# Patient Record
Sex: Female | Born: 1980 | Race: White | Hispanic: No | Marital: Single | Smoking: Never smoker
Health system: Southern US, Community
[De-identification: ages and names within clinical notes are randomized; demographics above are authoritative.]

## PROBLEM LIST (undated history)

## (undated) ENCOUNTER — Inpatient Hospital Stay (HOSPITAL_COMMUNITY): Payer: Self-pay

## (undated) DIAGNOSIS — N390 Urinary tract infection, site not specified: Secondary | ICD-10-CM

## (undated) DIAGNOSIS — Z789 Other specified health status: Secondary | ICD-10-CM

## (undated) DIAGNOSIS — O139 Gestational [pregnancy-induced] hypertension without significant proteinuria, unspecified trimester: Secondary | ICD-10-CM

## (undated) HISTORY — DX: Other specified health status: Z78.9

## (undated) HISTORY — PX: TONSILLECTOMY: SUR1361

---

## 2012-11-19 ENCOUNTER — Ambulatory Visit (INDEPENDENT_AMBULATORY_CARE_PROVIDER_SITE_OTHER): Payer: Self-pay | Admitting: Family Medicine

## 2012-11-19 ENCOUNTER — Encounter: Payer: Self-pay | Admitting: Family Medicine

## 2012-11-19 VITALS — BP 108/71 | Temp 98.5°F | Ht 59.0 in | Wt 149.0 lb

## 2012-11-19 DIAGNOSIS — O0930 Supervision of pregnancy with insufficient antenatal care, unspecified trimester: Secondary | ICD-10-CM | POA: Insufficient documentation

## 2012-11-19 DIAGNOSIS — O34219 Maternal care for unspecified type scar from previous cesarean delivery: Secondary | ICD-10-CM | POA: Insufficient documentation

## 2012-11-19 LAB — POCT URINALYSIS DIP (DEVICE)
Ketones, ur: NEGATIVE mg/dL
Protein, ur: NEGATIVE mg/dL
Urobilinogen, UA: 0.2 mg/dL (ref 0.0–1.0)
pH: 7.5 (ref 5.0–8.0)

## 2012-11-19 NOTE — Progress Notes (Signed)
Subjective:    Holly Terrell is being seen today for her first obstetrical visit.  This is not a planned pregnancy. She is at [redacted]w[redacted]d gestation by LMP. Her obstetrical history is significant for late prenatal care and previous cesarean section for breech presentation. Relationship with FOB: not with this FOB. Patient does intend to breast feed. Her first child had a partial cleft palate and was unable to latch on. Pregnancy history fully reviewed. Denies bleeding, cramping, loss of fluid today. Is feeling fetal movement.  Last pap May 2013, normal. No abnormal paps or colpos. No history of STDs.  Menstrual History: OB History    Grav Para Term Preterm Abortions TAB SAB Ect Mult Living   2 1 1  0 0 0 0 0 0 1       Patient's last menstrual period was 06/09/2012.    The following portions of the patient's history were reviewed and updated as appropriate: allergies, current medications, past family history, past medical history, past social history, past surgical history and problem list.  Review of Systems CONST:  No fever, chills, sweats HEENT: No vision changes CV: no chest pain, palpitations RESP: no cough, asthma, dyspnea GI:  Mild occasional heartburn, nausea. No diarrhea or constipation GU:  No dysuria or vaginal discharge MUSC-SKEL: Negative SKIN:  negative NEURO: Daily headaches, history of migraines. Takes tylenol or nothing and HA usually resolves   Objective:   Filed Vitals:   11/19/12 1048  BP: 108/71  Temp: 98.5 F (36.9 C)   GEN:  WNWD, no distress HEENT:  NCAT, EOMI, normal conjunctiva NECK: trachea midline, supple, no adenopathy, masses or thyromegaly CV:  RRR, no murmurs LUNGS: CTAB ABD:  Soft, gravid, fundal height 25 cm, normal bowel sounds, no tenderness, guarding or rebound PELV: normal external genitalia, normal vagina, moderate white vaginal discharge, normal cervix, no CMT EXTREM: warm, well perfused, no edema or tenderness NEURO: alert, oriented, no focal  deficits   Assessment:    Pregnancy at 23w 3d by LMP Reflux History of prior c-section for breech Headaches   Plan:    Initial labs drawn. Prenatal vitamins. Problem list reviewed and updated. AFP3 discussed: too late Role of ultrasound in pregnancy discussed; fetal survey: ordered Amniocentesis discussed: not indicated Pt desires TOLAC, discussed risks/benefits. Needs consent signed. Will get records from [place] of prior pregnancy and delivery, op note. Tylenol for headaches. Follow up in 4 weeks. 50% of 45 min visit spent on counseling and coordination of care.

## 2012-11-19 NOTE — Patient Instructions (Signed)
Pregnancy - Second Trimester The second trimester of pregnancy (3 to 6 months) is a period of rapid growth for you and your baby. At the end of the sixth month, your baby is about 9 inches long and weighs 1 1/2 pounds. You will begin to feel the baby move between 18 and 20 weeks of the pregnancy. This is called quickening. Weight gain is faster. A clear fluid (colostrum) may leak out of your breasts. You may feel small contractions of the womb (uterus). This is known as false labor or Braxton-Hicks contractions. This is like a practice for labor when the baby is ready to be born. Usually, the problems with morning sickness have usually passed by the end of your first trimester. Some women develop small dark blotches (called cholasma, mask of pregnancy) on their face that usually goes away after the baby is born. Exposure to the sun makes the blotches worse. Acne may also develop in some pregnant women and pregnant women who have acne, may find that it goes away. PRENATAL EXAMS  Blood work may continue to be done during prenatal exams. These tests are done to check on your health and the probable health of your baby. Blood work is used to follow your blood levels (hemoglobin). Anemia (low hemoglobin) is common during pregnancy. Iron and vitamins are given to help prevent this. You will also be checked for diabetes between 24 and 28 weeks of the pregnancy. Some of the previous blood tests may be repeated.  The size of the uterus is measured during each visit. This is to make sure that the baby is continuing to grow properly according to the dates of the pregnancy.  Your blood pressure is checked every prenatal visit. This is to make sure you are not getting toxemia.  Your urine is checked to make sure you do not have an infection, diabetes or protein in the urine.  Your weight is checked often to make sure gains are happening at the suggested rate. This is to ensure that both you and your baby are growing  normally.  Sometimes, an ultrasound is performed to confirm the proper growth and development of the baby. This is a test which bounces harmless sound waves off the baby so your caregiver can more accurately determine due dates. Sometimes, a specialized test is done on the amniotic fluid surrounding the baby. This test is called an amniocentesis. The amniotic fluid is obtained by sticking a needle into the belly (abdomen). This is done to check the chromosomes in instances where there is a concern about possible genetic problems with the baby. It is also sometimes done near the end of pregnancy if an early delivery is required. In this case, it is done to help make sure the baby's lungs are mature enough for the baby to live outside of the womb. CHANGES OCCURING IN THE SECOND TRIMESTER OF PREGNANCY Your body goes through many changes during pregnancy. They vary from person to person. Talk to your caregiver about changes you notice that you are concerned about.  During the second trimester, you will likely have an increase in your appetite. It is normal to have cravings for certain foods. This varies from person to person and pregnancy to pregnancy.  Your lower abdomen will begin to bulge.  You may have to urinate more often because the uterus and baby are pressing on your bladder. It is also common to get more bladder infections during pregnancy (pain with urination). You can help this by   drinking lots of fluids and emptying your bladder before and after intercourse.  You may begin to get stretch marks on your hips, abdomen, and breasts. These are normal changes in the body during pregnancy. There are no exercises or medications to take that prevent this change.  You may begin to develop swollen and bulging veins (varicose veins) in your legs. Wearing support hose, elevating your feet for 15 minutes, 3 to 4 times a day and limiting salt in your diet helps lessen the problem.  Heartburn may develop  as the uterus grows and pushes up against the stomach. Antacids recommended by your caregiver helps with this problem. Also, eating smaller meals 4 to 5 times a day helps.  Constipation can be treated with a stool softener or adding bulk to your diet. Drinking lots of fluids, vegetables, fruits, and whole grains are helpful.  Exercising is also helpful. If you have been very active up until your pregnancy, most of these activities can be continued during your pregnancy. If you have been less active, it is helpful to start an exercise program such as walking.  Hemorrhoids (varicose veins in the rectum) may develop at the end of the second trimester. Warm sitz baths and hemorrhoid cream recommended by your caregiver helps hemorrhoid problems.  Backaches may develop during this time of your pregnancy. Avoid heavy lifting, wear low heal shoes and practice good posture to help with backache problems.  Some pregnant women develop tingling and numbness of their hand and fingers because of swelling and tightening of ligaments in the wrist (carpel tunnel syndrome). This goes away after the baby is born.  As your breasts enlarge, you may have to get a bigger bra. Get a comfortable, cotton, support bra. Do not get a nursing bra until the last month of the pregnancy if you will be nursing the baby.  You may get a dark line from your belly button to the pubic area called the linea nigra.  You may develop rosy cheeks because of increase blood flow to the face.  You may develop spider looking lines of the face, neck, arms and chest. These go away after the baby is born. HOME CARE INSTRUCTIONS   It is extremely important to avoid all smoking, herbs, alcohol, and unprescribed drugs during your pregnancy. These chemicals affect the formation and growth of the baby. Avoid these chemicals throughout the pregnancy to ensure the delivery of a healthy infant.  Most of your home care instructions are the same as  suggested for the first trimester of your pregnancy. Keep your caregiver's appointments. Follow your caregiver's instructions regarding medication use, exercise and diet.  During pregnancy, you are providing food for you and your baby. Continue to eat regular, well-balanced meals. Choose foods such as meat, fish, milk and other low fat dairy products, vegetables, fruits, and whole-grain breads and cereals. Your caregiver will tell you of the ideal weight gain.  A physical sexual relationship may be continued up until near the end of pregnancy if there are no other problems. Problems could include early (premature) leaking of amniotic fluid from the membranes, vaginal bleeding, abdominal pain, or other medical or pregnancy problems.  Exercise regularly if there are no restrictions. Check with your caregiver if you are unsure of the safety of some of your exercises. The greatest weight gain will occur in the last 2 trimesters of pregnancy. Exercise will help you:  Control your weight.  Get you in shape for labor and delivery.  Lose weight   after you have the baby.  Wear a good support or jogging bra for breast tenderness during pregnancy. This may help if worn during sleep. Pads or tissues may be used in the bra if you are leaking colostrum.  Do not use hot tubs, steam rooms or saunas throughout the pregnancy.  Wear your seat belt at all times when driving. This protects you and your baby if you are in an accident.  Avoid raw meat, uncooked cheese, cat litter boxes and soil used by cats. These carry germs that can cause birth defects in the baby.  The second trimester is also a good time to visit your dentist for your dental health if this has not been done yet. Getting your teeth cleaned is OK. Use a soft toothbrush. Brush gently during pregnancy.  It is easier to loose urine during pregnancy. Tightening up and strengthening the pelvic muscles will help with this problem. Practice stopping your  urination while you are going to the bathroom. These are the same muscles you need to strengthen. It is also the muscles you would use as if you were trying to stop from passing gas. You can practice tightening these muscles up 10 times a set and repeating this about 3 times per day. Once you know what muscles to tighten up, do not perform these exercises during urination. It is more likely to contribute to an infection by backing up the urine.  Ask for help if you have financial, counseling or nutritional needs during pregnancy. Your caregiver will be able to offer counseling for these needs as well as refer you for other special needs.  Your skin may become oily. If so, wash your face with mild soap, use non-greasy moisturizer and oil or cream based makeup. MEDICATIONS AND DRUG USE IN PREGNANCY  Take prenatal vitamins as directed. The vitamin should contain 1 milligram of folic acid. Keep all vitamins out of reach of children. Only a couple vitamins or tablets containing iron may be fatal to a baby or young child when ingested.  Avoid use of all medications, including herbs, over-the-counter medications, not prescribed or suggested by your caregiver. Only take over-the-counter or prescription medicines for pain, discomfort, or fever as directed by your caregiver. Do not use aspirin.  Let your caregiver also know about herbs you may be using.  Alcohol is related to a number of birth defects. This includes fetal alcohol syndrome. All alcohol, in any form, should be avoided completely. Smoking will cause low birth rate and premature babies.  Street or illegal drugs are very harmful to the baby. They are absolutely forbidden. A baby born to an addicted mother will be addicted at birth. The baby will go through the same withdrawal an adult does. SEEK MEDICAL CARE IF:  You have any concerns or worries during your pregnancy. It is better to call with your questions if you feel they cannot wait, rather  than worry about them. SEEK IMMEDIATE MEDICAL CARE IF:   An unexplained oral temperature above 102 F (38.9 C) develops, or as your caregiver suggests.  You have leaking of fluid from the vagina (birth canal). If leaking membranes are suspected, take your temperature and tell your caregiver of this when you call.  There is vaginal spotting, bleeding, or passing clots. Tell your caregiver of the amount and how many pads are used. Light spotting in pregnancy is common, especially following intercourse.  You develop a bad smelling vaginal discharge with a change in the color from clear   to white.  You continue to feel sick to your stomach (nauseated) and have no relief from remedies suggested. You vomit blood or coffee ground-like materials.  You lose more than 2 pounds of weight or gain more than 2 pounds of weight over 1 week, or as suggested by your caregiver.  You notice swelling of your face, hands, feet, or legs.  You get exposed to German measles and have never had them.  You are exposed to fifth disease or chickenpox.  You develop belly (abdominal) pain. Round ligament discomfort is a common non-cancerous (benign) cause of abdominal pain in pregnancy. Your caregiver still must evaluate you.  You develop a bad headache that does not go away.  You develop fever, diarrhea, pain with urination, or shortness of breath.  You develop visual problems, blurry, or double vision.  You fall or are in a car accident or any kind of trauma.  There is mental or physical violence at home. Document Released: 09/25/2001 Document Revised: 12/24/2011 Document Reviewed: 03/30/2009 [employer] Patient Information 2013 [employer].  

## 2012-11-19 NOTE — Progress Notes (Signed)
Pulse: 85

## 2012-11-20 ENCOUNTER — Ambulatory Visit (HOSPITAL_COMMUNITY)
Admission: RE | Admit: 2012-11-20 | Discharge: 2012-11-20 | Disposition: A | Discharge status: Home / Self Care | Payer: Medicaid Other | Source: Ambulatory Visit | Attending: Family Medicine | Admitting: Family Medicine

## 2012-11-20 ENCOUNTER — Encounter: Payer: Self-pay | Admitting: Family Medicine

## 2012-11-20 ENCOUNTER — Other Ambulatory Visit: Payer: Self-pay | Admitting: Family Medicine

## 2012-11-20 DIAGNOSIS — O283 Abnormal ultrasonic finding on antenatal screening of mother: Secondary | ICD-10-CM

## 2012-11-20 DIAGNOSIS — O0930 Supervision of pregnancy with insufficient antenatal care, unspecified trimester: Secondary | ICD-10-CM

## 2012-11-20 DIAGNOSIS — O34219 Maternal care for unspecified type scar from previous cesarean delivery: Secondary | ICD-10-CM

## 2012-11-20 LAB — OBSTETRIC PANEL
Antibody Screen: NEGATIVE
Basophils Absolute: 0 10*3/uL (ref 0.0–0.1)
Basophils Relative: 0 % (ref 0–1)
Eosinophils Relative: 1 % (ref 0–5)
HCT: 31.2 % — ABNORMAL LOW (ref 36.0–46.0)
MCHC: 34 g/dL (ref 30.0–36.0)
Monocytes Absolute: 0.5 10*3/uL (ref 0.1–1.0)
Neutro Abs: 5.8 10*3/uL (ref 1.7–7.7)
RDW: 14.7 % (ref 11.5–15.5)

## 2012-11-24 ENCOUNTER — Telehealth: Payer: Self-pay | Admitting: *Deleted

## 2012-11-24 NOTE — Telephone Encounter (Signed)
Called Holly Terrell and notified her that her provider had asked [place] to call and let her know the ultrasound results came back as abnormal needing further detailed ultrasound- and that we have made that appt in [facility] and after scan she will meet with the specialist to go over results.  Gave patient the appointment and answered questions. Patient voices understanding.

## 2012-11-24 NOTE — Telephone Encounter (Signed)
Message copied by Gerome Apley on Mon Nov 24, 2012  8:55 AM ------      Message from: FERRY, [place]      Created: Thu Nov 20, 2012  6:17 PM       Abnormal fetal anatomy scan. I put in referral to [facility]FM for genetic counseling/f/u scan of fetal lateral ventricles. I don't know what they told the patient when they were doing the ultrasound. How do we best handle this? Do you need me to call her?  Truitt Merle ------

## 2012-11-26 ENCOUNTER — Other Ambulatory Visit: Payer: Self-pay | Admitting: Family Medicine

## 2012-11-26 DIAGNOSIS — O359XX0 Maternal care for (suspected) fetal abnormality and damage, unspecified, not applicable or unspecified: Secondary | ICD-10-CM

## 2012-11-28 ENCOUNTER — Ambulatory Visit (HOSPITAL_COMMUNITY): Admission: RE | Admit: 2012-11-28 | Payer: Self-pay | Source: Ambulatory Visit

## 2012-11-28 ENCOUNTER — Ambulatory Visit (HOSPITAL_COMMUNITY): Payer: Self-pay

## 2012-12-02 ENCOUNTER — Encounter: Payer: Self-pay | Admitting: Family Medicine

## 2012-12-02 DIAGNOSIS — O283 Abnormal ultrasonic finding on antenatal screening of mother: Secondary | ICD-10-CM | POA: Insufficient documentation

## 2012-12-03 ENCOUNTER — Ambulatory Visit (HOSPITAL_COMMUNITY)
Admission: RE | Admit: 2012-12-03 | Discharge: 2012-12-03 | Disposition: A | Discharge status: Home / Self Care | Payer: Medicaid Other | Source: Ambulatory Visit | Attending: Family Medicine | Admitting: Family Medicine

## 2012-12-03 ENCOUNTER — Encounter (HOSPITAL_COMMUNITY): Payer: Self-pay

## 2012-12-03 ENCOUNTER — Other Ambulatory Visit: Payer: Self-pay

## 2012-12-03 ENCOUNTER — Other Ambulatory Visit: Payer: Self-pay | Admitting: Family Medicine

## 2012-12-03 VITALS — BP 109/63 | HR 100 | Wt 154.2 lb

## 2012-12-03 DIAGNOSIS — Z3689 Encounter for other specified antenatal screening: Secondary | ICD-10-CM | POA: Insufficient documentation

## 2012-12-03 DIAGNOSIS — O359XX0 Maternal care for (suspected) fetal abnormality and damage, unspecified, not applicable or unspecified: Secondary | ICD-10-CM

## 2012-12-03 DIAGNOSIS — O283 Abnormal ultrasonic finding on antenatal screening of mother: Secondary | ICD-10-CM

## 2012-12-03 DIAGNOSIS — O358XX0 Maternal care for other (suspected) fetal abnormality and damage, not applicable or unspecified: Secondary | ICD-10-CM | POA: Insufficient documentation

## 2012-12-03 NOTE — Progress Notes (Signed)
Genetic Counseling  High-Risk Gestation Note  Appointment Date:  12/03/2012 Referred By: Napoleon Form, MD Date of Birth:  10-Sep-1981  Pregnancy History: G2P1001 Estimated Date of Delivery: 03/16/13 Estimated Gestational Age: [redacted]w[redacted]d Attending: Alpha Gula, MD  I met with Ms. Holly Terrell for genetic counseling because of an abnormal ultrasound finding.  Ms. Shone friend attended the appointment today.  We began by reviewing the ultrasound in detail. Ultrasound today revealed ventriculomegaly, with the left lateral ventricle measuring 14 mm and the right measuring 10 mm.  We discussed that ventriculomegaly refers to an abnormal accumulation of cerebrospinal fluid, resulting in enlargement of the cerebral ventricles.  Ventriculomegaly can occur as an isolated finding, but it can be associated with other intracranial and/or extracranial abnormalities, some of which may not be identified until after birth.  Some of the more commonly associated extracranial anomalies include spina bifida and renal, cardiac, and gastrointestinal anomalies.  The remainder of the fetal anatomy appeared normal today.  The ultrasound report will be documented separately.  We discussed that the finding of fetal ventriculomegaly is associated with an approximate 9 fold increase in risk for aneuploidy, specifically Down syndrome.  We reviewed chromosomes, nondisjunction, and the common features and prognoses of Down syndrome, trisomy 46, and trisomy 71.  In addition, we discussed other etiologies for fetal ventriculomegaly including other chromosome aberrations (deletions, duplications, inversions, and translocations), single gene conditions, multifactorial conditions, sporadic causes (intracranial hemorrhage), and environmental causes (infections).  [name] was counseled regarding available screening and diagnostic options for detection of fetal aneuploidy.  Specifically, we discussed the option of noninvasive prenatal testing  (NIPT) and amniocentesis.  NIPT analyzes cell free fetal DNA found in the maternal circulation. This test is not diagnostic for chromosome conditions, but can provide information regarding the presence or absence of extra fetal DNA for chromosomes 13, 18, 21, X, and Y, and missing fetal DNA for chromosome X (Turner syndrome) and Y. Thus, it would not identify or rule out all genetic conditions. The reported detection rate is greater than 99% for Trisomy 21, greater than 98% for Trisomy 18, and is approximately 80% (8 out of 10) for Trisomy 13. The false positive rate is reported to be less than 0.1% for any of these conditions.   In addition, we discussed the risks, benefits, and limitations of amniocentesis, including the approximate 1 in 300-500 risk for complications, including spontaneous pregnancy loss. We discussed that cells derived from amniocentesis can be used for standard karyotype analysis and/or microarray analysis.  She understands that microarray analysis may identify genomic copy number variants that may be missed with standard karyotyping. In addition, she was informed that amniocentesis can identify some infections and neural tube defects, but typically does not provide information regarding the possibility of single-gene conditions since such syndromes are not detected by routine karyotyping.  Single-gene conditions are typically diagnosed through postnatal genetic testing following evaluation by a medical geneticist, unless the ultrasound findings are strongly suggestive of a specific single-gene condition.  After consideration of all the options, she elected to proceed with NIPT.  The results will be available in ~8-10 business days.   She was counseled that the prognosis for ventriculomegaly detected prenatally is variable, being highly dependent upon the presence of associated intracranial and extracranial anomalies and the underlying cause of the ventriculomegaly.  The degree of  ventriculomegaly does not always correlate with the prognosis, as an occasional infant with severe ventriculomegaly (>15 mm) has normal intellectual development.  However, most infants with severe  ventriculomegaly have some degree of intellectual impairment and are at risk for seizures and other health problems.  It was noted that ventriculomegaly (postnatally referred to as hydrocephalus) is commonly treated with ventriculoperitoneal shunting.  While this treatment does not correct any damage to the brain that may have occurred in utero, it generally improves survival and decreases morbidity.    Because ventriculomegaly can regress or worsen during pregnancy, we discussed the option of serial sonography.  Should this finding persist/worsen, we discussed the availability of consultation with a pediatric neurosurgeon.    Both family histories were reviewed and found to be noncontributory for birth defects, mental retardation, and known genetic conditions.  A note in [name] chart states that her son was born with mild cleft lip.  [name] stated today that this was not the case.  She said that her son had difficulty with breast feeding/latching on because of a high arched palate; however, he did not have any orofacial clefting at birth.  Without further information regarding the provided family history, an accurate genetic risk cannot be calculated. Further genetic counseling is warranted if more information is obtained.  [name] was provided with written information regarding cystic fibrosis (CF) including the carrier frequency and incidence in the Caucasian population, the availability of carrier testing and prenatal diagnosis if indicated.  In addition, we discussed that CF is routinely screened for as part of the [place] newborn screening panel.  She declined testing today.   [name] denied exposure to environmental toxins or chemical agents. She denied the use of alcohol, tobacco or street drugs. She  denied significant viral illnesses during the course of her pregnancy.   I counseled [name] regarding the above risks and available options.  The approximate face-to-face time with the genetic counselor was 38 minutes.  Despina Arias, MS Certified Genetic Counselor

## 2012-12-03 NOTE — Progress Notes (Signed)
Markala Sitts  was seen today for an ultrasound appointment.  See full report in [facility].  Comments: Ms. Tucciarone was seen today due to suspected ventriculomegaly on recent ultrasound.  On exam today, the right lateral ventricle measures approximately 10 mm; the left 14 mm.  The remainder of the cranial anatomy and spine appear normal.  No cranial calficiations were noted that would be suspicious of  a viral infection. No markers associated with aneuploidy were noted.  The findings and limitations of the study were discussed with the patient.  We brieflly reviewed the differential diagnosis.  Mild ventriculomegaly may be associated with aneuploidy (LR 9) or fetal viral infections (TORCH infections).  The patient presented late to care and maternal serum screening was not performed.  See separate Genetics Counseling note.  The patient elected to undergo NIPT.  In the absence of other ultrasound findings, will defer viral serologies- but would entertain a work up if this finding is persistent at follow up.  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  Alpha Gula, MD

## 2012-12-04 ENCOUNTER — Encounter: Payer: Self-pay | Admitting: Family Medicine

## 2012-12-12 ENCOUNTER — Telehealth (HOSPITAL_COMMUNITY): Payer: Self-pay

## 2012-12-12 NOTE — Telephone Encounter (Signed)
Called Holly Terrell to discuss her [facility], cell free fetal DNA testing. Patient was identified by name and DOB. Testing was offered because of the ultrasound finding of ventriculomegaly. We reviewed that these are within normal limits, showing a less than 1 in 10,000 risk for trisomies 21, 18 and 13. We reviewed that this testing identifies > 99% of pregnancies with trisomy 21, >98% of pregnancies with trisomy 42, and >80% with trisomy 66; the false positive rate is <0.1% for all conditions. Testing for X and Y chromosome analysis was not performed. She understands that this testing does not identify all genetic conditions. All questions were answered to her satisfaction, she was encouraged to call with additional questions or concerns.  Despina Arias, MS Certified Genetic Counselor

## 2012-12-17 ENCOUNTER — Ambulatory Visit (INDEPENDENT_AMBULATORY_CARE_PROVIDER_SITE_OTHER): Payer: Medicaid Other | Admitting: Advanced Practice Midwife

## 2012-12-17 VITALS — BP 116/64 | Temp 97.1°F | Wt 157.0 lb

## 2012-12-17 DIAGNOSIS — O0930 Supervision of pregnancy with insufficient antenatal care, unspecified trimester: Secondary | ICD-10-CM

## 2012-12-17 DIAGNOSIS — O99810 Abnormal glucose complicating pregnancy: Secondary | ICD-10-CM

## 2012-12-17 DIAGNOSIS — K219 Gastro-esophageal reflux disease without esophagitis: Secondary | ICD-10-CM

## 2012-12-17 DIAGNOSIS — O0933 Supervision of pregnancy with insufficient antenatal care, third trimester: Secondary | ICD-10-CM

## 2012-12-17 LAB — POCT URINALYSIS DIP (DEVICE)
Bilirubin Urine: NEGATIVE
Glucose, UA: NEGATIVE mg/dL
Nitrite: NEGATIVE
Urobilinogen, UA: 0.2 mg/dL (ref 0.0–1.0)

## 2012-12-17 NOTE — Addendum Note (Signed)
Addended by: Franchot Mimes on: 12/17/2012 11:49 AM   Modules accepted: Orders

## 2012-12-17 NOTE — Progress Notes (Signed)
Pulse 99 Vaginal d/c stated as thin clear.

## 2012-12-17 NOTE — Addendum Note (Signed)
Addended by: Franchot Mimes on: 12/17/2012 11:01 AM   Modules accepted: Orders

## 2012-12-17 NOTE — Progress Notes (Signed)
Doing well. One episode of acid reflux. Does not want med Rx now. Probably moving to [place] in April. Discussed we will need to copy her records for her trip.

## 2012-12-17 NOTE — Patient Instructions (Signed)
Heartburn During Pregnancy  Heartburn is a burning sensation in the chest caused by stomach acid backing up into the esophagus. Heartburn (also known as "reflux") is common in pregnancy because a certain hormone (progesterone) changes. The progesterone hormone may relax the valve that separates the esophagus from the stomach. This allows acid to go up into the esophagus, causing heartburn. Heartburn may also happen in pregnancy because the enlarging uterus pushes up on the stomach, which pushes more acid into the esophagus. This is especially true in the later stages of pregnancy. Heartburn problems usually go away after giving birth. CAUSES   The progesterone hormone.  Changing hormone levels.  The growing uterus that pushes stomach acid upward.  Large meals.  Certain foods and drinks.  Exercise.  Increased acid production. SYMPTOMS   Burning pain in the chest or lower throat.  Bitter taste in the mouth.  Coughing. DIAGNOSIS  Heartburn is typically diagnosed by your caregiver when taking a careful history of your concern. Your caregiver may order a blood test to check for a certain type of bacteria that is associated with heartburn. Sometimes, heartburn is diagnosed by prescribing a heartburn medicine to see if the symptoms improve. It is rare in pregnancy to have a procedure called an endoscopy. This is when a tube with a light and a camera on the end is used to examine the esophagus and the stomach. TREATMENT   Your caregiver may tell you to use certain over-the-counter medicines (antacids, acid reducers) for mild heartburn.  Your caregiver may prescribe medicines to decrease stomach acid or to protect your stomach lining.  Your caregiver may recommend certain diet changes.  For severe cases, your caregiver may recommend that the head of the bed be elevated on blocks. (Sleeping with more pillows is not an effective treatment as it only changes the position of your head and does  not improve the main problem of stomach acid refluxing into the esophagus.) HOME CARE INSTRUCTIONS   Take all medicines as directed by your caregiver.  Raise the head of your bed by putting blocks under the legs if instructed to by your caregiver.  Do not exercise right after eating.  Avoid eating 2 or 3 hours before bed. Do not lie down right after eating.  Eat small meals throughout the day instead of 3 large meals.  Identify foods and beverages that make your symptoms worse and avoid them. Foods you may want to avoid include:  Peppers.  Chocolate.  High-fat foods, including fried foods.  Spicy foods.  Garlic and onions.  Citrus fruits, including oranges, grapefruit, lemons, and limes.  Food containing tomatoes or tomato products.  Mint.  Carbonated and caffeinated drinks.  Vinegar. SEEK IMMEDIATE MEDICAL CARE IF:   You have severe chest pain that goes down your arm or into your jaw or neck.  You feel sweaty, dizzy, or lightheaded.  You become short of breath.  You vomit blood.  You have difficulty or pain with swallowing.  You have bloody or black, tarry stools.  You have episodes of heartburn more than 3 times a week, for more than 2 weeks. MAKE SURE YOU:  Understand these instructions.  Will watch your condition.  Will get help right away if you are not doing well or get worse. Document Released: 09/28/2000 Document Revised: 12/24/2011 Document Reviewed: 03/22/2011 [employer] Patient Information 2013 [employer].  

## 2012-12-18 DIAGNOSIS — O99810 Abnormal glucose complicating pregnancy: Secondary | ICD-10-CM | POA: Insufficient documentation

## 2012-12-19 ENCOUNTER — Telehealth: Payer: Self-pay | Admitting: Obstetrics and Gynecology

## 2012-12-19 NOTE — Telephone Encounter (Addendum)
Message copied by Toula Moos on Fri Dec 19, 2012 10:02 AM ------      Message from: [facility] Signs      Created: Thu Dec 18, 2012 10:01 AM      Regarding: need 3 hr GTT       Glucola 136            Needs 3 hr GTT            Thanks ------ CALLED PATIENT and notified of abnormal 1 hr GCT and appt made for 3 hr GTT. PATIENT AGREES and satisfied.

## 2012-12-24 ENCOUNTER — Other Ambulatory Visit: Payer: Medicaid Other

## 2012-12-24 DIAGNOSIS — R7309 Other abnormal glucose: Secondary | ICD-10-CM

## 2012-12-25 ENCOUNTER — Encounter (HOSPITAL_COMMUNITY): Payer: Self-pay

## 2012-12-25 ENCOUNTER — Inpatient Hospital Stay (HOSPITAL_COMMUNITY)
Admission: AD | Admit: 2012-12-25 | Discharge: 2012-12-25 | Disposition: A | Discharge status: Home / Self Care | Payer: Medicaid Other | Source: Ambulatory Visit | Attending: Obstetrics and Gynecology | Admitting: Obstetrics and Gynecology

## 2012-12-25 ENCOUNTER — Encounter: Payer: Self-pay | Admitting: Obstetrics and Gynecology

## 2012-12-25 DIAGNOSIS — D649 Anemia, unspecified: Secondary | ICD-10-CM | POA: Insufficient documentation

## 2012-12-25 DIAGNOSIS — O99019 Anemia complicating pregnancy, unspecified trimester: Secondary | ICD-10-CM | POA: Insufficient documentation

## 2012-12-25 DIAGNOSIS — O212 Late vomiting of pregnancy: Secondary | ICD-10-CM | POA: Insufficient documentation

## 2012-12-25 DIAGNOSIS — O99012 Anemia complicating pregnancy, second trimester: Secondary | ICD-10-CM

## 2012-12-25 DIAGNOSIS — O219 Vomiting of pregnancy, unspecified: Secondary | ICD-10-CM

## 2012-12-25 LAB — COMPREHENSIVE METABOLIC PANEL
AST: 11 U/L (ref 0–37)
Albumin: 2.4 g/dL — ABNORMAL LOW (ref 3.5–5.2)
Calcium: 8.8 mg/dL (ref 8.4–10.5)
Creatinine, Ser: 0.56 mg/dL (ref 0.50–1.10)

## 2012-12-25 LAB — URINALYSIS, ROUTINE W REFLEX MICROSCOPIC
Glucose, UA: NEGATIVE mg/dL
Nitrite: NEGATIVE
Protein, ur: NEGATIVE mg/dL
Urobilinogen, UA: 0.2 mg/dL (ref 0.0–1.0)

## 2012-12-25 LAB — CBC
MCH: 28.7 pg (ref 26.0–34.0)
MCHC: 33.5 g/dL (ref 30.0–36.0)
MCV: 85.7 fL (ref 78.0–100.0)
Platelets: 177 10*3/uL (ref 150–400)
RDW: 12.9 % (ref 11.5–15.5)
WBC: 10.1 10*3/uL (ref 4.0–10.5)

## 2012-12-25 LAB — GLUCOSE TOLERANCE, 3 HOURS
Glucose Tolerance, 1 hour: 115 mg/dL (ref 70–189)
Glucose Tolerance, Fasting: 76 mg/dL (ref 70–104)

## 2012-12-25 LAB — URINE MICROSCOPIC-ADD ON

## 2012-12-25 MED ORDER — ONDANSETRON HCL 4 MG PO TABS: 4.0000 mg | ORAL_TABLET | Freq: Every day | ORAL | Status: DC | PRN

## 2012-12-25 MED ORDER — ONDANSETRON 8 MG PO TBDP
8.0000 mg | ORAL_TABLET | ORAL | Status: AC
  Administered 2012-12-25: 8 mg via ORAL
  Filled 2012-12-25: qty 1

## 2012-12-25 MED ORDER — FERROUS SULFATE 325 (65 FE) MG PO TABS: 325.0000 mg | ORAL_TABLET | Freq: Two times a day (BID) | ORAL | Status: DC

## 2012-12-25 NOTE — MAU Note (Signed)
Name and DOB verified, pt confirmed spelling on armboard is correct.

## 2012-12-25 NOTE — MAU Note (Signed)
Patient is in with c/o new onset nausea and fatigue. She states that she feels dizzy when she gets up. She denies vaginal bleeding or lof. She reports good fetal movement and feels pelvic pressure with fetal movements.

## 2012-12-25 NOTE — MAU Provider Note (Signed)
Chief Complaint:  Nausea and Fatigue   First Provider Initiated Contact with Patient 12/25/12 1037      HPI: Holly Terrell is a 32 y.o. G2P1001 at [redacted]w[redacted]d who presents to maternity admissions reporting nausea and increased fatigue since waking up this morning.  She has kept down fluids today but reports she has not felt like eating anything. She denies feeling cramping/contractions but does feel pelvic pressure whenever the baby moves, a few times an hour. She reports good fetal movement, denies LOF, vaginal bleeding, vaginal itching/burning, urinary symptoms, h/a, dizziness, n/v, or fever/chills.     Past Medical History: Past Medical History  Diagnosis Date  . No pertinent past medical history     Past obstetric history: OB History   Grav Para Term Preterm Abortions TAB SAB Ect Mult Living   2 1 1  0 0 0 0 0 0 1     # Outc Date GA Lbr Len/2nd Wgt Sex Del Anes PTL Lv   1 TRM 5/08 [redacted]w[redacted]d   M LTCS   Yes   Comments: BReech   2 CUR               Past Surgical History: Past Surgical History  Procedure Laterality Date  . Cesarean section    . Tonsillectomy      Family History: Family History  Problem Relation Age of Onset  . Hypertension Mother   . Birth defects Son     Social History: History  Substance Use Topics  . Smoking status: Never Smoker   . Smokeless tobacco: Never Used  . Alcohol Use: No    Allergies: No Known Allergies  Meds:  Prescriptions prior to admission  Medication Sig Dispense Refill  . calcium carbonate (TUMS - DOSED IN MG ELEMENTAL CALCIUM) 500 MG chewable tablet Chew 2 tablets by mouth as needed for heartburn.      . Prenatal Vit-Fe Fumarate-FA (PRENATAL MULTIVITAMIN) TABS Take 1 tablet by mouth daily at 12 noon.        ROS: Pertinent findings in history of present illness.  Physical Exam  Blood pressure 115/57, pulse 100, temperature 98.2 F (36.8 C), resp. rate 16, weight 73.936 kg (163 lb), last menstrual period 06/09/2012, SpO2  99.00%. GENERAL: Well-developed, well-nourished female in no acute distress.  HEENT: normocephalic HEART: normal rate RESP: normal effort ABDOMEN: Soft, non-tender, gravid appropriate for gestational age EXTREMITIES: Nontender, no edema NEURO: alert and oriented  Dilation: Closed Effacement (%): Thick Cervical Position: Posterior Exam by:: lisa leftwich-kirby cnm  FHT:  Baseline 135 , moderate variability, accelerations present, no decelerations Contractions: some irritability noted, mild to palpation   Labs: Results for orders placed during the hospital encounter of 12/25/12 (from the past 24 hour(s))  URINALYSIS, ROUTINE W REFLEX MICROSCOPIC     Status: Abnormal   Collection Time    12/25/12 10:05 AM      Result Value Range   Color, Urine YELLOW  YELLOW   APPearance CLEAR  CLEAR   Specific Gravity, Urine <1.005 (*) 1.005 - 1.030   pH 6.5  5.0 - 8.0   Glucose, UA NEGATIVE  NEGATIVE mg/dL   Hgb urine dipstick NEGATIVE  NEGATIVE   Bilirubin Urine NEGATIVE  NEGATIVE   Ketones, ur NEGATIVE  NEGATIVE mg/dL   Protein, ur NEGATIVE  NEGATIVE mg/dL   Urobilinogen, UA 0.2  0.0 - 1.0 mg/dL   Nitrite NEGATIVE  NEGATIVE   Leukocytes, UA TRACE (*) NEGATIVE  URINE MICROSCOPIC-ADD ON     Status: None  Collection Time    12/25/12 10:05 AM      Result Value Range   Squamous Epithelial / LPF RARE  RARE   WBC, UA 0-2  <3 WBC/hpf   Bacteria, UA RARE  RARE  CBC     Status: Abnormal   Collection Time    12/25/12 10:57 AM      Result Value Range   WBC 10.1  4.0 - 10.5 K/uL   RBC 3.00 (*) 3.87 - 5.11 MIL/uL   Hemoglobin 8.6 (*) 12.0 - 15.0 g/dL   HCT 16.1 (*) 09.6 - 04.5 %   MCV 85.7  78.0 - 100.0 fL   MCH 28.7  26.0 - 34.0 pg   MCHC 33.5  30.0 - 36.0 g/dL   RDW 40.9  81.1 - 91.4 %   Platelets 177  150 - 400 K/uL  COMPREHENSIVE METABOLIC PANEL     Status: Abnormal   Collection Time    12/25/12 10:57 AM      Result Value Range   Sodium 136  135 - 145 mEq/L   Potassium 4.0  3.5 -  5.1 mEq/L   Chloride 104  96 - 112 mEq/L   CO2 25  19 - 32 mEq/L   Glucose, Bld 86  70 - 99 mg/dL   BUN 3 (*) 6 - 23 mg/dL   Creatinine, Ser 7.82  0.50 - 1.10 mg/dL   Calcium 8.8  8.4 - 95.6 mg/dL   Total Protein 5.9 (*) 6.0 - 8.3 g/dL   Albumin 2.4 (*) 3.5 - 5.2 g/dL   AST 11  0 - 37 U/L   ALT 8  0 - 35 U/L   Alkaline Phosphatase 75  39 - 117 U/L   Total Bilirubin 0.3  0.3 - 1.2 mg/dL   GFR calc non Af Amer >90  >90 mL/min   GFR calc Af Amer >90  >90 mL/min     Assessment: 1. Anemia complicating pregnancy in second trimester   2. Nausea/vomiting in pregnancy     Plan: Discharge home Iron supplement daily in addition to PNV Zofran 4 mg Q8 hours PRN F/U in clinic Return to MAU as needed     Medication List    TAKE these medications       calcium carbonate 500 MG chewable tablet  Commonly known as:  TUMS - dosed in mg elemental calcium  Chew 2 tablets by mouth as needed for heartburn.     ferrous sulfate 325 (65 FE) MG tablet  Commonly known as:  FERROUSUL  Take 1 tablet (325 mg total) by mouth 2 (two) times daily.     ondansetron 4 MG tablet  Commonly known as:  ZOFRAN  Take 1 tablet (4 mg total) by mouth daily as needed for nausea.     prenatal multivitamin Tabs  Take 1 tablet by mouth daily at 12 noon.        Sharen Counter Certified Nurse-Midwife 12/25/2012 10:59 AM

## 2012-12-25 NOTE — MAU Note (Signed)
Started getting nauseated this morning.  Coughing- noted pinkish/blood in mucous.  When up and walking is feeling pelvic pressure.  Since arrived is now feeling really weak.

## 2012-12-25 NOTE — MAU Provider Note (Signed)
Attestation of Attending Supervision of Advanced Practitioner (CNM/NP): Evaluation and management procedures were performed by the Advanced Practitioner under my supervision and collaboration.  I have reviewed the Advanced Practitioner's note and chart, and I agree with the management and plan.  Olyver Hawes 12/25/2012 11:36 PM

## 2012-12-31 ENCOUNTER — Ambulatory Visit (INDEPENDENT_AMBULATORY_CARE_PROVIDER_SITE_OTHER): Payer: Medicaid Other | Admitting: Advanced Practice Midwife

## 2012-12-31 ENCOUNTER — Encounter (HOSPITAL_COMMUNITY): Payer: Self-pay

## 2012-12-31 ENCOUNTER — Ambulatory Visit (HOSPITAL_COMMUNITY)
Admission: RE | Admit: 2012-12-31 | Discharge: 2012-12-31 | Disposition: A | Discharge status: Home / Self Care | Payer: Medicaid Other | Source: Ambulatory Visit | Attending: Family Medicine | Admitting: Family Medicine

## 2012-12-31 VITALS — BP 129/68 | HR 104 | Wt 160.5 lb

## 2012-12-31 VITALS — BP 115/66 | Temp 97.9°F | Wt 158.0 lb

## 2012-12-31 DIAGNOSIS — O283 Abnormal ultrasonic finding on antenatal screening of mother: Secondary | ICD-10-CM

## 2012-12-31 DIAGNOSIS — O99810 Abnormal glucose complicating pregnancy: Secondary | ICD-10-CM

## 2012-12-31 DIAGNOSIS — O0933 Supervision of pregnancy with insufficient antenatal care, third trimester: Secondary | ICD-10-CM

## 2012-12-31 DIAGNOSIS — Z3403 Encounter for supervision of normal first pregnancy, third trimester: Secondary | ICD-10-CM

## 2012-12-31 DIAGNOSIS — O0930 Supervision of pregnancy with insufficient antenatal care, unspecified trimester: Secondary | ICD-10-CM

## 2012-12-31 DIAGNOSIS — O358XX0 Maternal care for other (suspected) fetal abnormality and damage, not applicable or unspecified: Secondary | ICD-10-CM | POA: Insufficient documentation

## 2012-12-31 LAB — POCT URINALYSIS DIP (DEVICE)
Bilirubin Urine: NEGATIVE
Glucose, UA: NEGATIVE mg/dL
Hgb urine dipstick: NEGATIVE
Ketones, ur: NEGATIVE mg/dL
pH: 7.5 (ref 5.0–8.0)

## 2012-12-31 NOTE — Progress Notes (Signed)
Pulse- 100  Edema-feet/vaginal  Vaginal discharge- clear

## 2012-12-31 NOTE — Progress Notes (Signed)
Holly Terrell  was seen today for an ultrasound appointment.  See full report in [facility].  Comments: Holly Terrell returns for follow up due to mild bilateral cerebral ventriculomegaly.  NIPT returned low risk for aneuploidy.  On exam today, the right lateral ventricle measures approximately 11 mm; the left 13 mm.  The patient was offerred and declined TORCH serologies.  Impression: Single IUP at 29 2/7 weeks Mild bilateral cerebral ventriculomegaly  The remainder of the fetal anatomy appears normal The estimated fetal weight today is at the 83rd %tile Normal amniotic fluid volume  Recommendations: Recommend follow-up ultrasound examination in 4 weeks for interval growth and to reevaluate fetal cerebral anatomy.  Alpha Gula, MD

## 2012-12-31 NOTE — Patient Instructions (Signed)
Braxton Hicks Contractions  Pregnancy is commonly associated with contractions of the uterus throughout the pregnancy. Towards the end of pregnancy (32 to 34 weeks), these contractions (Braxton Hicks) can develop more often and may become more forceful. This is not true labor because these contractions do not result in opening (dilatation) and thinning of the cervix. They are sometimes difficult to tell apart from true labor because these contractions can be forceful and people have different pain tolerances. You should not feel embarrassed if you go to the hospital with false labor. Sometimes, the only way to tell if you are in true labor is for your caregiver to follow the changes in the cervix.  How to tell the difference between true and false labor:  · False labor.  · The contractions of false labor are usually shorter, irregular and not as hard as those of true labor.  · They are often felt in the front of the lower abdomen and in the groin.  · They may leave with walking around or changing positions while lying down.  · They get weaker and are shorter lasting as time goes on.  · These contractions are usually irregular.  · They do not usually become progressively stronger, regular and closer together as with true labor.  · True labor.  · Contractions in true labor last 30 to 70 seconds, become very regular, usually become more intense, and increase in frequency.  · They do not go away with walking.  · The discomfort is usually felt in the top of the uterus and spreads to the lower abdomen and low back.  · True labor can be determined by your caregiver with an exam. This will show that the cervix is dilating and getting thinner.  If there are no prenatal problems or other health problems associated with the pregnancy, it is completely safe to be sent home with false labor and await the onset of true labor.  HOME CARE INSTRUCTIONS   · Keep up with your usual exercises and instructions.  · Take medications as  directed.  · Keep your regular prenatal appointment.  · Eat and drink lightly if you think you are going into labor.  · If BH contractions are making you uncomfortable:  · Change your activity position from lying down or resting to walking/walking to resting.  · Sit and rest in a tub of warm water.  · Drink 2 to 3 glasses of water. Dehydration may cause B-H contractions.  · Do slow and deep breathing several times an hour.  SEEK IMMEDIATE MEDICAL CARE IF:   · Your contractions continue to become stronger, more regular, and closer together.  · You have a gushing, burst or leaking of fluid from the vagina.  · An oral temperature above 102° F (38.9° C) develops.  · You have passage of blood-tinged mucus.  · You develop vaginal bleeding.  · You develop continuous belly (abdominal) pain.  · You have low back pain that you never had before.  · You feel the baby's head pushing down causing pelvic pressure.  · The baby is not moving as much as it used to.  Document Released: 10/01/2005 Document Revised: 12/24/2011 Document Reviewed: 03/25/2009  [employer]® Patient Information ©2013 [employer].

## 2012-12-31 NOTE — Progress Notes (Signed)
Few BH. Has F/U US for mild ventriculomegy scheduled for 12/31/12.

## 2013-01-14 ENCOUNTER — Ambulatory Visit (INDEPENDENT_AMBULATORY_CARE_PROVIDER_SITE_OTHER): Payer: Medicaid Other | Admitting: Family Medicine

## 2013-01-14 VITALS — BP 112/67 | Temp 98.3°F | Wt 162.0 lb

## 2013-01-14 DIAGNOSIS — O0930 Supervision of pregnancy with insufficient antenatal care, unspecified trimester: Secondary | ICD-10-CM

## 2013-01-14 DIAGNOSIS — O0933 Supervision of pregnancy with insufficient antenatal care, third trimester: Secondary | ICD-10-CM

## 2013-01-14 LAB — POCT URINALYSIS DIP (DEVICE)
Bilirubin Urine: NEGATIVE
Glucose, UA: NEGATIVE mg/dL
Hgb urine dipstick: NEGATIVE
Ketones, ur: NEGATIVE mg/dL
pH: 7.5 (ref 5.0–8.0)

## 2013-01-14 NOTE — Patient Instructions (Addendum)
Pregnancy - Third Trimester  The third trimester of pregnancy (the last 3 months) is a period of the most rapid growth for you and your baby. The baby approaches a length of 20 inches and a weight of 6 to 10 pounds. The baby is adding on fat and getting ready for life outside your body. While inside, babies have periods of sleeping and waking, suck their thumbs, and hiccups. You can often feel small contractions of the uterus. This is false labor. It is also called Braxton-Hicks contractions. This is like a practice for labor. The usual problems in this stage of pregnancy include more difficulty breathing, swelling of the hands and feet from water retention, and having to urinate more often because of the uterus and baby pressing on your bladder.   PRENATAL EXAMS  · Blood work may continue to be done during prenatal exams. These tests are done to check on your health and the probable health of your baby. Blood work is used to follow your blood levels (hemoglobin). Anemia (low hemoglobin) is common during pregnancy. Iron and vitamins are given to help prevent this. You may also continue to be checked for diabetes. Some of the past blood tests may be done again.  · The size of the uterus is measured during each visit. This makes sure your baby is growing properly according to your pregnancy dates.  · Your blood pressure is checked every prenatal visit. This is to make sure you are not getting toxemia.  · Your urine is checked every prenatal visit for infection, diabetes and protein.  · Your weight is checked at each visit. This is done to make sure gains are happening at the suggested rate and that you and your baby are growing normally.  · Sometimes, an ultrasound is performed to confirm the position and the proper growth and development of the baby. This is a test done that bounces harmless sound waves off the baby so your caregiver can more accurately determine due dates.  · Discuss the type of pain medication and  anesthesia you will have during your labor and delivery.  · Discuss the possibility and anesthesia if a Cesarean Section might be necessary.  · Inform your caregiver if there is any mental or physical violence at home.  Sometimes, a specialized non-stress test, contraction stress test and biophysical profile are done to make sure the baby is not having a problem. Checking the amniotic fluid surrounding the baby is called an amniocentesis. The amniotic fluid is removed by sticking a needle into the belly (abdomen). This is sometimes done near the end of pregnancy if an early delivery is required. In this case, it is done to help make sure the baby's lungs are mature enough for the baby to live outside of the womb. If the lungs are not mature and it is unsafe to deliver the baby, an injection of cortisone medication is given to the mother 1 to 2 days before the delivery. This helps the baby's lungs mature and makes it safer to deliver the baby.  CHANGES OCCURING IN THE THIRD TRIMESTER OF PREGNANCY  Your body goes through many changes during pregnancy. They vary from person to person. Talk to your caregiver about changes you notice and are concerned about.  · During the last trimester, you have probably had an increase in your appetite. It is normal to have cravings for certain foods. This varies from person to person and pregnancy to pregnancy.  · You may begin to   get stretch marks on your hips, abdomen, and breasts. These are normal changes in the body during pregnancy. There are no exercises or medications to take which prevent this change.  · Constipation may be treated with a stool softener or adding bulk to your diet. Drinking lots of fluids, fiber in vegetables, fruits, and whole grains are helpful.  · Exercising is also helpful. If you have been very active up until your pregnancy, most of these activities can be continued during your pregnancy. If you have been less active, it is helpful to start an exercise  program such as walking. Consult your caregiver before starting exercise programs.  · Avoid all smoking, alcohol, un-prescribed drugs, herbs and "street drugs" during your pregnancy. These chemicals affect the formation and growth of the baby. Avoid chemicals throughout the pregnancy to ensure the delivery of a healthy infant.  · Backache, varicose veins and hemorrhoids may develop or get worse.  · You will tire more easily in the third trimester, which is normal.  · The baby's movements may be stronger and more often.  · You may become short of breath easily.  · Your belly button may stick out.  · A yellow discharge may leak from your breasts called colostrum.  · You may have a bloody mucus discharge. This usually occurs a few days to a week before labor begins.  HOME CARE INSTRUCTIONS   · Keep your caregiver's appointments. Follow your caregiver's instructions regarding medication use, exercise, and diet.  · During pregnancy, you are providing food for you and your baby. Continue to eat regular, well-balanced meals. Choose foods such as meat, fish, milk and other low fat dairy products, vegetables, fruits, and whole-grain breads and cereals. Your caregiver will tell you of the ideal weight gain.  · A physical sexual relationship may be continued throughout pregnancy if there are no other problems such as early (premature) leaking of amniotic fluid from the membranes, vaginal bleeding, or belly (abdominal) pain.  · Exercise regularly if there are no restrictions. Check with your caregiver if you are unsure of the safety of your exercises. Greater weight gain will occur in the last 2 trimesters of pregnancy. Exercising helps:  · Control your weight.  · Get you in shape for labor and delivery.  · You lose weight after you deliver.  · Rest a lot with legs elevated, or as needed for leg cramps or low back pain.  · Wear a good support or jogging bra for breast tenderness during pregnancy. This may help if worn during  sleep. Pads or tissues may be used in the bra if you are leaking colostrum.  · Do not use hot tubs, steam rooms, or saunas.  · Wear your seat belt when driving. This protects you and your baby if you are in an accident.  · Avoid raw meat, cat litter boxes and soil used by cats. These carry germs that can cause birth defects in the baby.  · It is easier to loose urine during pregnancy. Tightening up and strengthening the pelvic muscles will help with this problem. You can practice stopping your urination while you are going to the bathroom. These are the same muscles you need to strengthen. It is also the muscles you would use if you were trying to stop from passing gas. You can practice tightening these muscles up 10 times a set and repeating this about 3 times per day. Once you know what muscles to tighten up, do not perform these   exercises during urination. It is more likely to cause an infection by backing up the urine.  · Ask for help if you have financial, counseling or nutritional needs during pregnancy. Your caregiver will be able to offer counseling for these needs as well as refer you for other special needs.  · Make a list of emergency phone numbers and have them available.  · Plan on getting help from family or friends when you go home from the hospital.  · Make a trial run to the hospital.  · Take prenatal classes with the father to understand, practice and ask questions about the labor and delivery.  · Prepare the baby's room/nursery.  · Do not travel out of the city unless it is absolutely necessary and with the advice of your caregiver.  · Wear only low or no heal shoes to have better balance and prevent falling.  MEDICATIONS AND DRUG USE IN PREGNANCY  · Take prenatal vitamins as directed. The vitamin should contain 1 milligram of folic acid. Keep all vitamins out of reach of children. Only a couple vitamins or tablets containing iron may be fatal to a baby or young child when ingested.  · Avoid use  of all medications, including herbs, over-the-counter medications, not prescribed or suggested by your caregiver. Only take over-the-counter or prescription medicines for pain, discomfort, or fever as directed by your caregiver. Do not use aspirin, ibuprofen (Motrin®, Advil®, Nuprin®) or naproxen (Aleve®) unless OK'd by your caregiver.  · Let your caregiver also know about herbs you may be using.  · Alcohol is related to a number of birth defects. This includes fetal alcohol syndrome. All alcohol, in any form, should be avoided completely. Smoking will cause low birth rate and premature babies.  · Street/illegal drugs are very harmful to the baby. They are absolutely forbidden. A baby born to an addicted mother will be addicted at birth. The baby will go through the same withdrawal an adult does.  SEEK MEDICAL CARE IF:  You have any concerns or worries during your pregnancy. It is better to call with your questions if you feel they cannot wait, rather than worry about them.  DECISIONS ABOUT CIRCUMCISION  You may or may not know the sex of your baby. If you know your baby is a boy, it may be time to think about circumcision. Circumcision is the removal of the foreskin of the penis. This is the skin that covers the sensitive end of the penis. There is no proven medical need for this. Often this decision is made on what is popular at the time or based upon religious beliefs and social issues. You can discuss these issues with your caregiver or pediatrician.  SEEK IMMEDIATE MEDICAL CARE IF:   · An unexplained oral temperature above 102° F (38.9° C) develops, or as your caregiver suggests.  · You have leaking of fluid from the vagina (birth canal). If leaking membranes are suspected, take your temperature and tell your caregiver of this when you call.  · There is vaginal spotting, bleeding or passing clots. Tell your caregiver of the amount and how many pads are used.  · You develop a bad smelling vaginal discharge with  a change in the color from clear to white.  · You develop vomiting that lasts more than 24 hours.  · You develop chills or fever.  · You develop shortness of breath.  · You develop burning on urination.  · You loose more than 2 pounds of weight   or gain more than 2 pounds of weight or as suggested by your caregiver.  · You notice sudden swelling of your face, hands, and feet or legs.  · You develop belly (abdominal) pain. Round ligament discomfort is a common non-cancerous (benign) cause of abdominal pain in pregnancy. Your caregiver still must evaluate you.  · You develop a severe headache that does not go away.  · You develop visual problems, blurred or double vision.  · If you have not felt your baby move for more than 1 hour. If you think the baby is not moving as much as usual, eat something with sugar in it and lie down on your left side for an hour. The baby should move at least 4 to 5 times per hour. Call right away if your baby moves less than that.  · You fall, are in a car accident or any kind of trauma.  · There is mental or physical violence at home.  Document Released: 09/25/2001 Document Revised: 12/24/2011 Document Reviewed: 03/30/2009  [employer]® Patient Information ©2013 [employer].

## 2013-01-14 NOTE — Progress Notes (Signed)
Pulse 96 Edema trace in hand, ankles and feet.

## 2013-01-14 NOTE — Progress Notes (Signed)
Contractions a few times an hour for past 2 weeks, tightening - sounds like Braxton-Hicks. No bleeding or LOF. Baby moving. Mild bilateral ventriculomegaly on sono. Has repeat sono with [facility] 0n 01/29/13. Last u/s 3/19 showed EFW 83%, AFI 85%.

## 2013-01-28 ENCOUNTER — Ambulatory Visit (INDEPENDENT_AMBULATORY_CARE_PROVIDER_SITE_OTHER): Payer: Medicaid Other | Admitting: Family Medicine

## 2013-01-28 VITALS — BP 142/73 | Temp 97.4°F | Wt 164.0 lb

## 2013-01-28 DIAGNOSIS — O0933 Supervision of pregnancy with insufficient antenatal care, third trimester: Secondary | ICD-10-CM

## 2013-01-28 DIAGNOSIS — O283 Abnormal ultrasonic finding on antenatal screening of mother: Secondary | ICD-10-CM

## 2013-01-28 DIAGNOSIS — O289 Unspecified abnormal findings on antenatal screening of mother: Secondary | ICD-10-CM

## 2013-01-28 DIAGNOSIS — O0930 Supervision of pregnancy with insufficient antenatal care, unspecified trimester: Secondary | ICD-10-CM

## 2013-01-28 LAB — POCT URINALYSIS DIP (DEVICE)
Bilirubin Urine: NEGATIVE
Glucose, UA: NEGATIVE mg/dL
Hgb urine dipstick: NEGATIVE
Ketones, ur: NEGATIVE mg/dL
Nitrite: NEGATIVE
Protein, ur: NEGATIVE mg/dL
Specific Gravity, Urine: 1.02 (ref 1.005–1.030)
Urobilinogen, UA: 0.2 mg/dL (ref 0.0–1.0)
pH: 7 (ref 5.0–8.0)

## 2013-01-28 MED ORDER — CEPHALEXIN 500 MG PO CAPS: 500.0000 mg | ORAL_CAPSULE | Freq: Three times a day (TID) | ORAL | Status: DC

## 2013-01-28 NOTE — Progress Notes (Signed)
Vulvar/labial swelling with lumps in groin/labia, tender per pt. On exam, no visible lesions. Fullness of left upper labia majora. Mildly tender, no drainage, no fluctuance. Small inguinal lymph node palpated, tender, <1cm, mobile. Possible epidermal inclusion cyst or bartholin's (though a bit high for bartholin's). Warm compresses. Will give keflex for possible early abscess.

## 2013-01-28 NOTE — Progress Notes (Signed)
Pulse 102 2nd BP: 120/71   C/o left groin knot and pain; Left labia swollen

## 2013-01-28 NOTE — Patient Instructions (Addendum)
Epidermal Cyst An epidermal cyst is sometimes called a sebaceous cyst, epidermal inclusion cyst, or infundibular cyst. These cysts usually contain a substance that looks "pasty" or "cheesy" and may have a bad smell. This substance is a protein called keratin. Epidermal cysts are usually found on the face, neck, or trunk. They may also occur in the vaginal area or other parts of the genitalia of both men and women. Epidermal cysts are usually small, painless, slow-growing bumps or lumps that move freely under the skin. It is important not to try to pop them. This may cause an infection and lead to tenderness and swelling. CAUSES  Epidermal cysts may be caused by a deep penetrating injury to the skin or a plugged hair follicle, often associated with acne. SYMPTOMS  Epidermal cysts can become inflamed and cause:  Redness.  Tenderness.  Increased temperature of the skin over the bumps or lumps.  Grayish-white, bad smelling material that drains from the bump or lump. DIAGNOSIS  Epidermal cysts are easily diagnosed by your caregiver during an exam. Rarely, a tissue sample (biopsy) may be taken to rule out other conditions that may resemble epidermal cysts. TREATMENT   Epidermal cysts often get better and disappear on their own. They are rarely ever cancerous.  If a cyst becomes infected, it may become inflamed and tender. This may require opening and draining the cyst. Treatment with antibiotics may be necessary. When the infection is gone, the cyst may be removed with minor surgery.  Small, inflamed cysts can often be treated with antibiotics or by injecting steroid medicines.  Sometimes, epidermal cysts become large and bothersome. If this happens, surgical removal in your caregiver's office may be necessary. HOME CARE INSTRUCTIONS  Only take over-the-counter or prescription medicines as directed by your caregiver.  Take your antibiotics as directed. Finish them even if you start to feel  better. SEEK MEDICAL CARE IF:   Your cyst becomes tender, red, or swollen.  Your condition is not improving or is getting worse.  You have any other questions or concerns. MAKE SURE YOU:  Understand these instructions.  Will watch your condition.  Will get help right away if you are not doing well or get worse. Document Released: 09/01/2004 Document Revised: 12/24/2011 Document Reviewed: 04/09/2011 [facility] Patient Information 2013 [place], [place].  Pregnancy - Third Trimester The third trimester of pregnancy (the last 3 months) is a period of the most rapid growth for you and your baby. The baby approaches a length of 20 inches and a weight of 6 to 10 pounds. The baby is adding on fat and getting ready for life outside your body. While inside, babies have periods of sleeping and waking, suck their thumbs, and hiccups. You can often feel small contractions of the uterus. This is false labor. It is also called Braxton-Hicks contractions. This is like a practice for labor. The usual problems in this stage of pregnancy include more difficulty breathing, swelling of the hands and feet from water retention, and having to urinate more often because of the uterus and baby pressing on your bladder.  PRENATAL EXAMS  Blood work may continue to be done during prenatal exams. These tests are done to check on your health and the probable health of your baby. Blood work is used to follow your blood levels (hemoglobin). Anemia (low hemoglobin) is common during pregnancy. Iron and vitamins are given to help prevent this. You may also continue to be checked for diabetes. Some of the past blood tests may  be done again.  The size of the uterus is measured during each visit. This makes sure your baby is growing properly according to your pregnancy dates.  Your blood pressure is checked every prenatal visit. This is to make sure you are not getting toxemia.  Your urine is checked every prenatal visit for  infection, diabetes and protein.  Your weight is checked at each visit. This is done to make sure gains are happening at the suggested rate and that you and your baby are growing normally.  Sometimes, an ultrasound is performed to confirm the position and the proper growth and development of the baby. This is a test done that bounces harmless sound waves off the baby so your caregiver can more accurately determine due dates.  Discuss the type of pain medication and anesthesia you will have during your labor and delivery.  Discuss the possibility and anesthesia if a Cesarean Section might be necessary.  Inform your caregiver if there is any mental or physical violence at home. Sometimes, a specialized non-stress test, contraction stress test and biophysical profile are done to make sure the baby is not having a problem. Checking the amniotic fluid surrounding the baby is called an amniocentesis. The amniotic fluid is removed by sticking a needle into the belly (abdomen). This is sometimes done near the end of pregnancy if an early delivery is required. In this case, it is done to help make sure the baby's lungs are mature enough for the baby to live outside of the womb. If the lungs are not mature and it is unsafe to deliver the baby, an injection of cortisone medication is given to the mother 1 to 2 days before the delivery. This helps the baby's lungs mature and makes it safer to deliver the baby. CHANGES OCCURING IN THE THIRD TRIMESTER OF PREGNANCY Your body goes through many changes during pregnancy. They vary from person to person. Talk to your caregiver about changes you notice and are concerned about.  During the last trimester, you have probably had an increase in your appetite. It is normal to have cravings for certain foods. This varies from person to person and pregnancy to pregnancy.  You may begin to get stretch marks on your hips, abdomen, and breasts. These are normal changes in the  body during pregnancy. There are no exercises or medications to take which prevent this change.  Constipation may be treated with a stool softener or adding bulk to your diet. Drinking lots of fluids, fiber in vegetables, fruits, and whole grains are helpful.  Exercising is also helpful. If you have been very active up until your pregnancy, most of these activities can be continued during your pregnancy. If you have been less active, it is helpful to start an exercise program such as walking. Consult your caregiver before starting exercise programs.  Avoid all smoking, alcohol, un-prescribed drugs, herbs and "street drugs" during your pregnancy. These chemicals affect the formation and growth of the baby. Avoid chemicals throughout the pregnancy to ensure the delivery of a healthy infant.  Backache, varicose veins and hemorrhoids may develop or get worse.  You will tire more easily in the third trimester, which is normal.  The baby's movements may be stronger and more often.  You may become short of breath easily.  Your belly button may stick out.  A yellow discharge may leak from your breasts called colostrum.  You may have a bloody mucus discharge. This usually occurs a few days to a  week before labor begins. HOME CARE INSTRUCTIONS   Keep your caregiver's appointments. Follow your caregiver's instructions regarding medication use, exercise, and diet.  During pregnancy, you are providing food for you and your baby. Continue to eat regular, well-balanced meals. Choose foods such as meat, fish, milk and other low fat dairy products, vegetables, fruits, and whole-grain breads and cereals. Your caregiver will tell you of the ideal weight gain.  A physical sexual relationship may be continued throughout pregnancy if there are no other problems such as early (premature) leaking of amniotic fluid from the membranes, vaginal bleeding, or belly (abdominal) pain.  Exercise regularly if there are  no restrictions. Check with your caregiver if you are unsure of the safety of your exercises. Greater weight gain will occur in the last 2 trimesters of pregnancy. Exercising helps:  Control your weight.  Get you in shape for labor and delivery.  You lose weight after you deliver.  Rest a lot with legs elevated, or as needed for leg cramps or low back pain.  Wear a good support or jogging bra for breast tenderness during pregnancy. This may help if worn during sleep. Pads or tissues may be used in the bra if you are leaking colostrum.  Do not use hot tubs, steam rooms, or saunas.  Wear your seat belt when driving. This protects you and your baby if you are in an accident.  Avoid raw meat, cat litter boxes and soil used by cats. These carry germs that can cause birth defects in the baby.  It is easier to loose urine during pregnancy. Tightening up and strengthening the pelvic muscles will help with this problem. You can practice stopping your urination while you are going to the bathroom. These are the same muscles you need to strengthen. It is also the muscles you would use if you were trying to stop from passing gas. You can practice tightening these muscles up 10 times a set and repeating this about 3 times per day. Once you know what muscles to tighten up, do not perform these exercises during urination. It is more likely to cause an infection by backing up the urine.  Ask for help if you have financial, counseling or nutritional needs during pregnancy. Your caregiver will be able to offer counseling for these needs as well as refer you for other special needs.  Make a list of emergency phone numbers and have them available.  Plan on getting help from family or friends when you go home from the hospital.  Make a trial run to the hospital.  Take prenatal classes with the father to understand, practice and ask questions about the labor and delivery.  Prepare the baby's  room/nursery.  Do not travel out of the city unless it is absolutely necessary and with the advice of your caregiver.  Wear only low or no heal shoes to have better balance and prevent falling. MEDICATIONS AND DRUG USE IN PREGNANCY  Take prenatal vitamins as directed. The vitamin should contain 1 milligram of folic acid. Keep all vitamins out of reach of children. Only a couple vitamins or tablets containing iron may be fatal to a baby or young child when ingested.  Avoid use of all medications, including herbs, over-the-counter medications, not prescribed or suggested by your caregiver. Only take over-the-counter or prescription medicines for pain, discomfort, or fever as directed by your caregiver. Do not use aspirin, ibuprofen (Motrin, Advil, Nuprin) or naproxen (Aleve) unless OK'd by your caregiver.  Let your  caregiver also know about herbs you may be using.  Alcohol is related to a number of birth defects. This includes fetal alcohol syndrome. All alcohol, in any form, should be avoided completely. Smoking will cause low birth rate and premature babies.  Street/illegal drugs are very harmful to the baby. They are absolutely forbidden. A baby born to an addicted mother will be addicted at birth. The baby will go through the same withdrawal an adult does. SEEK MEDICAL CARE IF: You have any concerns or worries during your pregnancy. It is better to call with your questions if you feel they cannot wait, rather than worry about them. DECISIONS ABOUT CIRCUMCISION You may or may not know the sex of your baby. If you know your baby is a boy, it may be time to think about circumcision. Circumcision is the removal of the foreskin of the penis. This is the skin that covers the sensitive end of the penis. There is no proven medical need for this. Often this decision is made on what is popular at the time or based upon religious beliefs and social issues. You can discuss these issues with your  caregiver or pediatrician. SEEK IMMEDIATE MEDICAL CARE IF:   An unexplained oral temperature above 102 F (38.9 C) develops, or as your caregiver suggests.  You have leaking of fluid from the vagina (birth canal). If leaking membranes are suspected, take your temperature and tell your caregiver of this when you call.  There is vaginal spotting, bleeding or passing clots. Tell your caregiver of the amount and how many pads are used.  You develop a bad smelling vaginal discharge with a change in the color from clear to white.  You develop vomiting that lasts more than 24 hours.  You develop chills or fever.  You develop shortness of breath.  You develop burning on urination.  You loose more than 2 pounds of weight or gain more than 2 pounds of weight or as suggested by your caregiver.  You notice sudden swelling of your face, hands, and feet or legs.  You develop belly (abdominal) pain. Round ligament discomfort is a common non-cancerous (benign) cause of abdominal pain in pregnancy. Your caregiver still must evaluate you.  You develop a severe headache that does not go away.  You develop visual problems, blurred or double vision.  If you have not felt your baby move for more than 1 hour. If you think the baby is not moving as much as usual, eat something with sugar in it and lie down on your left side for an hour. The baby should move at least 4 to 5 times per hour. Call right away if your baby moves less than that.  You fall, are in a car accident or any kind of trauma.  There is mental or physical violence at home. Document Released: 09/25/2001 Document Revised: 12/24/2011 Document Reviewed: 03/30/2009 [facility] Patient Information 2013 [place], [place].

## 2013-01-29 ENCOUNTER — Ambulatory Visit (HOSPITAL_COMMUNITY): Payer: Medicaid Other

## 2013-01-30 ENCOUNTER — Ambulatory Visit (HOSPITAL_COMMUNITY)
Admission: RE | Admit: 2013-01-30 | Discharge: 2013-01-30 | Disposition: A | Discharge status: Home / Self Care | Payer: Medicaid Other | Source: Ambulatory Visit | Attending: Family Medicine | Admitting: Family Medicine

## 2013-01-30 ENCOUNTER — Encounter (HOSPITAL_COMMUNITY): Payer: Self-pay

## 2013-01-30 DIAGNOSIS — O3500X0 Maternal care for (suspected) central nervous system malformation or damage in fetus, unspecified, not applicable or unspecified: Secondary | ICD-10-CM | POA: Insufficient documentation

## 2013-01-30 DIAGNOSIS — O350XX0 Maternal care for (suspected) central nervous system malformation in fetus, not applicable or unspecified: Secondary | ICD-10-CM | POA: Insufficient documentation

## 2013-01-30 DIAGNOSIS — O34219 Maternal care for unspecified type scar from previous cesarean delivery: Secondary | ICD-10-CM | POA: Insufficient documentation

## 2013-01-30 DIAGNOSIS — O99810 Abnormal glucose complicating pregnancy: Secondary | ICD-10-CM

## 2013-01-30 DIAGNOSIS — O283 Abnormal ultrasonic finding on antenatal screening of mother: Secondary | ICD-10-CM

## 2013-02-03 ENCOUNTER — Encounter (HOSPITAL_COMMUNITY): Payer: Self-pay | Admitting: *Deleted

## 2013-02-03 ENCOUNTER — Inpatient Hospital Stay (HOSPITAL_COMMUNITY)
Admission: AD | Admit: 2013-02-03 | Discharge: 2013-02-04 | DRG: 778 | Disposition: A | Discharge status: Home / Self Care | Payer: Medicaid Other | Source: Ambulatory Visit | Attending: Obstetrics & Gynecology | Admitting: Obstetrics & Gynecology

## 2013-02-03 ENCOUNTER — Inpatient Hospital Stay (HOSPITAL_COMMUNITY): Payer: Medicaid Other

## 2013-02-03 DIAGNOSIS — O4700 False labor before 37 completed weeks of gestation, unspecified trimester: Secondary | ICD-10-CM

## 2013-02-03 DIAGNOSIS — R109 Unspecified abdominal pain: Secondary | ICD-10-CM | POA: Diagnosis present

## 2013-02-03 DIAGNOSIS — O99810 Abnormal glucose complicating pregnancy: Secondary | ICD-10-CM

## 2013-02-03 HISTORY — DX: Urinary tract infection, site not specified: N39.0

## 2013-02-03 LAB — URINALYSIS, ROUTINE W REFLEX MICROSCOPIC
Bilirubin Urine: NEGATIVE
Glucose, UA: NEGATIVE mg/dL
Specific Gravity, Urine: 1.02 (ref 1.005–1.030)
Urobilinogen, UA: 0.2 mg/dL (ref 0.0–1.0)
pH: 6 (ref 5.0–8.0)

## 2013-02-03 LAB — CBC
HCT: 36 % (ref 36.0–46.0)
Hemoglobin: 12 g/dL (ref 12.0–15.0)
MCV: 84.7 fL (ref 78.0–100.0)
RDW: 14.7 % (ref 11.5–15.5)
WBC: 13.4 10*3/uL — ABNORMAL HIGH (ref 4.0–10.5)

## 2013-02-03 LAB — KLEIHAUER-BETKE STAIN: Fetal Cells %: 0 %

## 2013-02-03 LAB — TYPE AND SCREEN: Antibody Screen: NEGATIVE

## 2013-02-03 LAB — URINE MICROSCOPIC-ADD ON

## 2013-02-03 LAB — OB RESULTS CONSOLE GBS: GBS: NEGATIVE

## 2013-02-03 LAB — GROUP B STREP BY PCR: Group B strep by PCR: NEGATIVE

## 2013-02-03 MED ORDER — LACTATED RINGERS IV SOLN
INTRAVENOUS | Status: DC
  Administered 2013-02-03 – 2013-02-04 (×3): via INTRAVENOUS

## 2013-02-03 MED ORDER — FERROUS SULFATE 325 (65 FE) MG PO TABS
325.0000 mg | ORAL_TABLET | Freq: Two times a day (BID) | ORAL | Status: DC
  Filled 2013-02-03: qty 1

## 2013-02-03 MED ORDER — IBUPROFEN 600 MG PO TABS: 600.0000 mg | ORAL_TABLET | Freq: Four times a day (QID) | ORAL | Status: DC | PRN

## 2013-02-03 MED ORDER — OXYTOCIN BOLUS FROM INFUSION: 500.0000 mL | INTRAVENOUS | Status: DC

## 2013-02-03 MED ORDER — ONDANSETRON HCL 4 MG/2ML IJ SOLN: 4.0000 mg | Freq: Four times a day (QID) | INTRAMUSCULAR | Status: DC | PRN

## 2013-02-03 MED ORDER — NALBUPHINE HCL 10 MG/ML IJ SOLN
10.0000 mg | Freq: Once | INTRAMUSCULAR | Status: AC
  Administered 2013-02-03: 10 mg via INTRAMUSCULAR
  Filled 2013-02-03: qty 1

## 2013-02-03 MED ORDER — CITRIC ACID-SODIUM CITRATE 334-500 MG/5ML PO SOLN: 30.0000 mL | ORAL | Status: DC | PRN

## 2013-02-03 MED ORDER — OXYTOCIN 40 UNITS IN LACTATED RINGERS INFUSION - SIMPLE MED: 62.5000 mL/h | INTRAVENOUS | Status: DC

## 2013-02-03 MED ORDER — LIDOCAINE HCL (PF) 1 % IJ SOLN: 30.0000 mL | INTRAMUSCULAR | Status: DC | PRN

## 2013-02-03 MED ORDER — NALBUPHINE SYRINGE 5 MG/0.5 ML: 10.0000 mg | INJECTION | INTRAMUSCULAR | Status: DC | PRN

## 2013-02-03 MED ORDER — FLEET ENEMA 7-19 GM/118ML RE ENEM: 1.0000 | ENEMA | RECTAL | Status: DC | PRN

## 2013-02-03 MED ORDER — NIFEDIPINE 10 MG PO CAPS
20.0000 mg | ORAL_CAPSULE | Freq: Once | ORAL | Status: AC
  Administered 2013-02-03: 20 mg via ORAL
  Filled 2013-02-03: qty 2

## 2013-02-03 MED ORDER — LACTATED RINGERS IV SOLN: 500.0000 mL | INTRAVENOUS | Status: DC | PRN

## 2013-02-03 MED ORDER — ACETAMINOPHEN 325 MG PO TABS: 650.0000 mg | ORAL_TABLET | ORAL | Status: DC | PRN

## 2013-02-03 MED ORDER — OXYCODONE-ACETAMINOPHEN 5-325 MG PO TABS
1.0000 | ORAL_TABLET | ORAL | Status: DC | PRN
  Administered 2013-02-03: 1 via ORAL
  Filled 2013-02-03: qty 1

## 2013-02-03 NOTE — MAU Note (Signed)
C/O abdominal pain and pressure for 2 days. States has continued to get worse and can't take it anymore. Says it is not contractions.

## 2013-02-03 NOTE — H&P (Signed)
Holly Terrell is a 32 y.o. female G2P1001 at [redacted]w[redacted]d presenting for severe (10/10) abdominal pain since Sunday (3 days).  Pain is all over abdomen, crampy/sharp, especially right and left sides. No bleeding or loss of fluid. Baby moving but a little less than usual. Tylenol has not helped. Some radiation to back.   Prenatal care in [facility], no complications of this pregnancy. Ventriculomegaly on ultrasound, being followed by MFM. Late prenatal care.    Maternal Medical History:  Reason for admission: Contractions.  Nausea.  Contractions: Onset was more than 2 days ago.   Perceived severity is strong.    Fetal activity: Perceived fetal activity is decreased.   Last perceived fetal movement was within the past hour.    Prenatal complications: no prenatal complications   OB History   Grav Para Term Preterm Abortions TAB SAB Ect Mult Living   2 1 1  0 0 0 0 0 0 1     Past Medical History  Diagnosis Date  . No pertinent past medical history   . Urinary tract infection    Past Surgical History  Procedure Laterality Date  . Cesarean section    . Tonsillectomy     Family History: family history includes Birth defects in her son and Hypertension in her mother. Social History:  reports that she has never smoked. She has never used smokeless tobacco. She reports that she does not drink alcohol or use illicit drugs.   Prenatal Transfer Tool  Maternal Diabetes: No Genetic Screening: Normal NIPT negative Maternal Ultrasounds/Referrals: Abnormal:  Findings:   Other:mild bilateral ventriculmegaly Fetal Ultrasounds or other Referrals:  Referred to Materal Fetal Medicine  Maternal Substance Abuse:  No Significant Maternal Medications:  None Significant Maternal Lab Results:  Lab values include: Other: GBS unknown Other Comments:  None  Review of Systems  Constitutional: Negative for fever and chills.  Eyes: Negative for blurred vision and double vision.  Gastrointestinal: Positive for abdominal  pain. Negative for nausea, vomiting, diarrhea and constipation.  Neurological: Negative for headaches.   Results for orders placed during the hospital encounter of 02/03/13 (from the past 24 hour(s))  URINALYSIS, ROUTINE W REFLEX MICROSCOPIC     Status: Abnormal   Collection Time    02/03/13  4:10 PM      Result Value Range   Color, Urine YELLOW  YELLOW   APPearance HAZY (*) CLEAR   Specific Gravity, Urine 1.020  1.005 - 1.030   pH 6.0  5.0 - 8.0   Glucose, UA NEGATIVE  NEGATIVE mg/dL   Hgb urine dipstick LARGE (*) NEGATIVE   Bilirubin Urine NEGATIVE  NEGATIVE   Ketones, ur NEGATIVE  NEGATIVE mg/dL   Protein, ur 30 (*) NEGATIVE mg/dL   Urobilinogen, UA 0.2  0.0 - 1.0 mg/dL   Nitrite NEGATIVE  NEGATIVE   Leukocytes, UA TRACE (*) NEGATIVE  URINE MICROSCOPIC-ADD ON     Status: Abnormal   Collection Time    02/03/13  4:10 PM      Result Value Range   Squamous Epithelial / LPF FEW (*) RARE   WBC, UA 0-2  <3 WBC/hpf   RBC / HPF 21-50  <3 RBC/hpf  CBC     Status: Abnormal   Collection Time    02/03/13  4:20 PM      Result Value Range   WBC 13.4 (*) 4.0 - 10.5 K/uL   RBC 4.25  3.87 - 5.11 MIL/uL   Hemoglobin 12.0  12.0 - 15.0 g/dL  HCT 36.0  36.0 - 46.0 %   MCV 84.7  78.0 - 100.0 fL   MCH 28.2  26.0 - 34.0 pg   MCHC 33.3  30.0 - 36.0 g/dL   RDW 16.1  09.6 - 04.5 %   Platelets 194  150 - 400 K/uL  TYPE AND SCREEN     Status: None   Collection Time    02/03/13  4:20 PM      Result Value Range   ABO/RH(D) A POS     Antibody Screen PENDING     Sample Expiration 02/06/2013    GROUP B STREP BY PCR     Status: None   Collection Time    02/03/13  6:59 PM      Result Value Range   Group B strep by PCR NEGATIVE  NEGATIVE    Dilation: 1 Effacement (%): Thick Station: -1 Exam by:: dr Yuri Flener Blood pressure 103/58, pulse 111, temperature 97.2 F (36.2 C), temperature source Oral, resp. rate 20, height 4\' 11"  (1.499 m), weight 76.204 kg (168 lb), last menstrual period 06/09/2012,  SpO2 98.00%. Exam Physical Exam  Constitutional: She is oriented to person, place, and time. She appears well-developed and well-nourished. Distressed: mild.  HENT:  Head: Normocephalic and atraumatic.  Eyes: Conjunctivae and EOM are normal.  Neck: Normal range of motion. Neck supple.  Cardiovascular: Regular rhythm and normal heart sounds.   Tachycardia - 110s  Respiratory: Effort normal and breath sounds normal. No respiratory distress.  GI: Bowel sounds are normal. There is no rebound and no guarding.  Abd gravid (size = dates), firm, tender diffusely but esp right side.   Genitourinary:  Normal external genitalia. Normal vagina. Cervix 1/50/-2, mid, a little posterior. Blood on glove after exam.   Musculoskeletal: Normal range of motion. She exhibits no edema and no tenderness.  Neurological: She is alert and oriented to person, place, and time.  Skin: Skin is warm and dry.  Psychiatric: She has a normal mood and affect.    FHTs;  140, moderate variability, accels present, no decels TOCO:  q 2-3 minutes initially, then more like irritability  Prenatal labs: ABO, Rh: A/POS/-- (02/05 1206) Antibody: NEG (02/05 1206) Rubella: 1.84 (02/05 1206) RPR: NON REAC (02/05 1206)  HBsAg: NEGATIVE (02/05 1206)  HIV: NON REACTIVE (03/19 1203)  GBS:     Assessment/Plan: 32 y.o. G2P1001 at [redacted]w[redacted]d with abdominal pain - Labor vs abruption vs appendicitis (pt hungry, no nausea/vomitin) - GBS pending - Admit to L&D for observation, continuous monitoring    Napoleon Form 02/03/2013, 6:42 PM

## 2013-02-03 NOTE — H&P (Signed)
Attestation of Attending Supervision of Advanced Practitioner (PA/CNM/NP): Evaluation and management procedures were performed by the Advanced Practitioner under my supervision and collaboration.  I have reviewed the Advanced Practitioner's note and chart, and I agree with the management and plan.  Alec Mcphee, MD, FACOG Attending Obstetrician & Gynecologist Faculty Practice, [facility]  

## 2013-02-04 LAB — WET PREP, GENITAL
Clue Cells Wet Prep HPF POC: NONE SEEN
Yeast Wet Prep HPF POC: NONE SEEN

## 2013-02-04 MED ORDER — OXYCODONE-ACETAMINOPHEN 5-325 MG PO TABS: 1.0000 | ORAL_TABLET | Freq: Four times a day (QID) | ORAL | Status: DC | PRN

## 2013-02-04 NOTE — Progress Notes (Signed)
Holly Terrell is a 32 y.o. G2P1001 at [redacted]w[redacted]d by LMP admitted for abdominal pain of unknown etiology (preterm labor vs. Abruption vs. Appendicitis)  Subjective: Pt reports pain has eased off some since getting pain medicine (percocet). Ate some chicken broth for dinner and feels like she would like to try to eat more.  Objective: BP 111/65  Pulse 95  Temp(Src) 98.5 F (36.9 C) (Oral)  Resp 18  Ht 4\' 11"  (1.499 m)  Wt 168 lb (76.204 kg)  BMI 33.91 kg/m2  SpO2 98%  LMP 06/09/2012      FHT:  FHR: 140 bpm, variability: moderate,  accelerations:  Present,  decelerations:  Absent UC:   irregular, every 2-3 minutes SVE:   Dilation: 1 Effacement (%): Thick Station: -1 Exam by:: dr ferry  Labs: Lab Results  Component Value Date   WBC 13.4* 02/03/2013   HGB 12.0 02/03/2013   HCT 36.0 02/03/2013   MCV 84.7 02/03/2013   PLT 194 02/03/2013    Assessment / Plan: Holly Terrell is a 32 y.o. G2P1001 at [redacted]w[redacted]d who presents with abdominal pain of unclear etiology. Fetal heart rate tracing reassuring (category I). Pain slightly better with percocet. Will continue to monitor for signs of active labor or worsening of pain.  Levert Feinstein 02/04/2013, 1:26 AM

## 2013-02-04 NOTE — Discharge Summary (Signed)
Obstetric Discharge Summary  Emanuela Runnion is a 32 y.o. G2P1001 admitted at [redacted]w[redacted]d for preterm contractions with concern for placental abruption.  Reason for Admission: threatened preterm labor Prenatal Procedures: NST and ultrasound  Hemoglobin  Date Value Range Status  02/03/2013 12.0  12.0 - 15.0 g/dL Final     HCT  Date Value Range Status  02/03/2013 36.0  36.0 - 46.0 % Final    Physical Exam:  General: alert, cooperative and no distress CV:  RRR, no murmur RESP:  CTAB ABD:  Gravid, size=dates, tender R side, mid DVT Evaluation: No evidence of DVT seen on physical exam. Negative Homan's sign. No cords or calf tenderness. No significant calf/ankle edema.   Results for orders placed during the hospital encounter of 02/03/13 (from the past 48 hour(s))  OB RESULTS CONSOLE GBS     Status: None   Collection Time    02/03/13 12:00 AM      Result Value Range   GBS Negative    URINALYSIS, ROUTINE W REFLEX MICROSCOPIC     Status: Abnormal   Collection Time    02/03/13  4:10 PM      Result Value Range   Color, Urine YELLOW  YELLOW   APPearance HAZY (*) CLEAR   Specific Gravity, Urine 1.020  1.005 - 1.030   pH 6.0  5.0 - 8.0   Glucose, UA NEGATIVE  NEGATIVE mg/dL   Hgb urine dipstick LARGE (*) NEGATIVE   Bilirubin Urine NEGATIVE  NEGATIVE   Ketones, ur NEGATIVE  NEGATIVE mg/dL   Protein, ur 30 (*) NEGATIVE mg/dL   Urobilinogen, UA 0.2  0.0 - 1.0 mg/dL   Nitrite NEGATIVE  NEGATIVE   Leukocytes, UA TRACE (*) NEGATIVE  URINE MICROSCOPIC-ADD ON     Status: Abnormal   Collection Time    02/03/13  4:10 PM      Result Value Range   Squamous Epithelial / LPF FEW (*) RARE   WBC, UA 0-2  <3 WBC/hpf   RBC / HPF 21-50  <3 RBC/hpf  CBC     Status: Abnormal   Collection Time    02/03/13  4:20 PM      Result Value Range   WBC 13.4 (*) 4.0 - 10.5 K/uL   RBC 4.25  3.87 - 5.11 MIL/uL   Hemoglobin 12.0  12.0 - 15.0 g/dL   HCT 95.2  84.1 - 32.4 %   MCV 84.7  78.0 - 100.0 fL   MCH 28.2   26.0 - 34.0 pg   MCHC 33.3  30.0 - 36.0 g/dL   RDW 40.1  02.7 - 25.3 %   Platelets 194  150 - 400 K/uL  TYPE AND SCREEN     Status: None   Collection Time    02/03/13  4:20 PM      Result Value Range   ABO/RH(D) A POS     Antibody Screen NEG     Sample Expiration 02/06/2013    RPR     Status: None   Collection Time    02/03/13  4:20 PM      Result Value Range   RPR NON REACTIVE  NON REACTIVE  ABO/RH     Status: None   Collection Time    02/03/13  4:20 PM      Result Value Range   ABO/RH(D) A POS    GROUP B STREP BY PCR     Status: None   Collection Time    02/03/13  6:59  PM      Result Value Range   Group B strep by PCR NEGATIVE  NEGATIVE   Comment:            The Xpert GBS PCR Assay is FDA     approved for intrapartum     specimens only.     Positive results are presumptive.  KLEIHAUER-BETKE STAIN     Status: None   Collection Time    02/03/13  7:35 PM      Result Value Range   Fetal Cells % 0     Quantitation Fetal Hemoglobin 0    WET PREP, GENITAL     Status: Abnormal   Collection Time    02/04/13  9:15 AM      Result Value Range   Yeast Wet Prep HPF POC NONE SEEN  NONE SEEN   Trich, Wet Prep NONE SEEN  NONE SEEN   Clue Cells Wet Prep HPF POC NONE SEEN  NONE SEEN   WBC, Wet Prep HPF POC MODERATE (*) NONE SEEN   Comment: FEW BACTERIA SEEN     Discharge Diagnoses: threatened preterm labor without delivery  Discharge Information: Date: 02/04/2013 Activity: pelvic rest Diet: routine Medications: PNV, Iron and Percocet Condition: stable Instructions: labor precautions Discharge to: home Follow-up Information   Follow up with [facility] On 02/11/2013. (10:00 am for prenatal visit)    Contact information:   [address] [place] [address] [phone]      Follow up with [facility]. (As needed if symptoms worsen - pain, bleeding, decreased fetal movement, contractions, leaking of fluid)    Contact information:    [address] [place] [address] [phone]       Napoleon Form 02/04/2013, 9:42 AM

## 2013-02-04 NOTE — Progress Notes (Signed)
Holly Terrell is a 32 y.o. G2P1001 at [redacted]w[redacted]d by LMP admitted for abdominal pain of unknown etiology (preterm labor vs. Abruption vs. Appendicitis)  Subjective: Still feeling a lot of pressure down below  Objective: BP 109/63  Pulse 87  Temp(Src) 98.2 F (36.8 C) (Tympanic)  Resp 18  Ht 4\' 11"  (1.499 m)  Wt 168 lb (76.204 kg)  BMI 33.91 kg/m2  SpO2 98%  LMP 06/09/2012      FHT:  FHR: 120 bpm, variability: moderate,  accelerations:  Present,  decelerations:  Absent UC:   irregular, every 2-3 minutes SVE:   Dilation: 1 Effacement (%): Thick Station: -1 Exam by:: dr ferry (have not re-examined since admission)  Labs: Lab Results  Component Value Date   WBC 13.4* 02/03/2013   HGB 12.0 02/03/2013   HCT 36.0 02/03/2013   MCV 84.7 02/03/2013   PLT 194 02/03/2013    Assessment / Plan: Holly Terrell is a 32 y.o. G2P1001 at [redacted]w[redacted]d who presents with abdominal pain of unclear etiology. Fetal heart rate tracing reassuring (category I). Will continue to monitor for signs of active labor or worsening of pain.  Levert Feinstein 02/04/2013, 8:02 AM

## 2013-02-05 LAB — OB RESULTS CONSOLE GC/CHLAMYDIA
Chlamydia: NEGATIVE
Gonorrhea: NEGATIVE

## 2013-02-11 ENCOUNTER — Encounter (HOSPITAL_COMMUNITY): Payer: Self-pay | Admitting: *Deleted

## 2013-02-11 ENCOUNTER — Inpatient Hospital Stay (HOSPITAL_COMMUNITY)
Admission: AD | Admit: 2013-02-11 | Discharge: 2013-02-13 | DRG: 781 | Disposition: A | Discharge status: Home / Self Care | Payer: Medicaid Other | Source: Ambulatory Visit | Attending: Obstetrics & Gynecology | Admitting: Obstetrics & Gynecology

## 2013-02-11 ENCOUNTER — Ambulatory Visit (INDEPENDENT_AMBULATORY_CARE_PROVIDER_SITE_OTHER): Payer: Medicaid Other | Admitting: Obstetrics and Gynecology

## 2013-02-11 ENCOUNTER — Other Ambulatory Visit: Payer: Self-pay | Admitting: Obstetrics and Gynecology

## 2013-02-11 ENCOUNTER — Inpatient Hospital Stay (HOSPITAL_COMMUNITY): Payer: Medicaid Other

## 2013-02-11 VITALS — BP 138/91 | Temp 98.6°F | Wt 182.8 lb

## 2013-02-11 DIAGNOSIS — IMO0002 Reserved for concepts with insufficient information to code with codable children: Secondary | ICD-10-CM

## 2013-02-11 DIAGNOSIS — O1490 Unspecified pre-eclampsia, unspecified trimester: Secondary | ICD-10-CM

## 2013-02-11 DIAGNOSIS — O3660X0 Maternal care for excessive fetal growth, unspecified trimester, not applicable or unspecified: Secondary | ICD-10-CM

## 2013-02-11 DIAGNOSIS — R7989 Other specified abnormal findings of blood chemistry: Secondary | ICD-10-CM

## 2013-02-11 DIAGNOSIS — O358XX0 Maternal care for other (suspected) fetal abnormality and damage, not applicable or unspecified: Secondary | ICD-10-CM | POA: Diagnosis present

## 2013-02-11 DIAGNOSIS — O0930 Supervision of pregnancy with insufficient antenatal care, unspecified trimester: Secondary | ICD-10-CM

## 2013-02-11 DIAGNOSIS — O1493 Unspecified pre-eclampsia, third trimester: Secondary | ICD-10-CM

## 2013-02-11 DIAGNOSIS — O34219 Maternal care for unspecified type scar from previous cesarean delivery: Secondary | ICD-10-CM | POA: Diagnosis present

## 2013-02-11 LAB — COMPREHENSIVE METABOLIC PANEL
BUN: 12 mg/dL (ref 6–23)
CO2: 23 mEq/L (ref 19–32)
Calcium: 8.9 mg/dL (ref 8.4–10.5)
Chloride: 101 mEq/L (ref 96–112)
Creatinine, Ser: 1.05 mg/dL (ref 0.50–1.10)
GFR calc Af Amer: 82 mL/min — ABNORMAL LOW (ref >90)
GFR calc non Af Amer: 71 mL/min — ABNORMAL LOW (ref >90)
Glucose, Bld: 76 mg/dL (ref 70–99)
Total Bilirubin: 0.4 mg/dL (ref 0.3–1.2)

## 2013-02-11 LAB — POCT URINALYSIS DIP (DEVICE)
Hgb urine dipstick: NEGATIVE
Nitrite: NEGATIVE
Specific Gravity, Urine: >=1.03 (ref 1.005–1.030)
pH: 6 (ref 5.0–8.0)

## 2013-02-11 LAB — PROTEIN / CREATININE RATIO, URINE
Protein Creatinine Ratio: 0.69 — ABNORMAL HIGH (ref 0.00–0.15)
Total Protein, Urine: 406.3 mg/dL

## 2013-02-11 LAB — TYPE AND SCREEN
ABO/RH(D): A POS
Antibody Screen: NEGATIVE

## 2013-02-11 LAB — CBC
HCT: 35.3 % — ABNORMAL LOW (ref 36.0–46.0)
Hemoglobin: 11.8 g/dL — ABNORMAL LOW (ref 12.0–15.0)
MCH: 28 pg (ref 26.0–34.0)
MCV: 83.6 fL (ref 78.0–100.0)
Platelets: 190 10*3/uL (ref 150–400)
RBC: 4.22 MIL/uL (ref 3.87–5.11)

## 2013-02-11 MED ORDER — SODIUM CHLORIDE 0.9 % IJ SOLN
3.0000 mL | Freq: Two times a day (BID) | INTRAMUSCULAR | Status: DC
  Administered 2013-02-11 – 2013-02-12 (×2): 3 mL via INTRAVENOUS

## 2013-02-11 MED ORDER — SODIUM CHLORIDE 0.9 % IV SOLN: 250.0000 mL | INTRAVENOUS | Status: DC | PRN

## 2013-02-11 MED ORDER — PRENATAL MULTIVITAMIN CH
1.0000 | ORAL_TABLET | Freq: Every day | ORAL | Status: DC
  Administered 2013-02-12 – 2013-02-13 (×2): 1 via ORAL
  Filled 2013-02-11 (×2): qty 1

## 2013-02-11 MED ORDER — DOCUSATE SODIUM 100 MG PO CAPS
100.0000 mg | ORAL_CAPSULE | Freq: Every day | ORAL | Status: DC
  Filled 2013-02-11 (×4): qty 1

## 2013-02-11 MED ORDER — CALCIUM CARBONATE ANTACID 500 MG PO CHEW
2.0000 | CHEWABLE_TABLET | ORAL | Status: DC | PRN
  Administered 2013-02-11 – 2013-02-12 (×2): 400 mg via ORAL
  Filled 2013-02-11 (×2): qty 1
  Filled 2013-02-11: qty 2

## 2013-02-11 MED ORDER — ZOLPIDEM TARTRATE 5 MG PO TABS: 5.0000 mg | ORAL_TABLET | Freq: Every evening | ORAL | Status: DC | PRN

## 2013-02-11 MED ORDER — SODIUM CHLORIDE 0.9 % IJ SOLN: 3.0000 mL | INTRAMUSCULAR | Status: DC | PRN

## 2013-02-11 MED ORDER — ACETAMINOPHEN 325 MG PO TABS
650.0000 mg | ORAL_TABLET | ORAL | Status: DC | PRN
  Administered 2013-02-11 – 2013-02-12 (×2): 650 mg via ORAL
  Filled 2013-02-11 (×2): qty 2

## 2013-02-11 NOTE — H&P (Signed)
Holly Terrell is a 32 y.o. G2P1001 at [redacted]w[redacted]d admitted for Preeclampsia.   0 Fetal presentation is cephalic.  History of Present Illness: Elevated blood pressure in clinic on 4/16 142/73 and today 138/91 and 145/100s. Decreased urine output per patient, increased lower extremity and upper extremity swelling. No headache, vision changes or RUQ pain. Had contractions in clinic this morning but not now.  Patient reports the fetal movement as active. Patient reports uterine contraction  activity as none. Patient reports  vaginal bleeding as none. Patient describes fluid per vagina as None. Had dampness this morning in clinic but fern negative.  Ultrasound 4/18 showed EFW > 90%.   Patient Active Problem List   Diagnosis Date Noted  . LGA (large for gestational age) fetus 02/11/2013  . Abnormal glucose tolerance test in pregnancy, antepartum 12/18/2012  . Abnormal fetal ultrasound 12/02/2012  . Previous cesarean delivery, antepartum condition or complication 11/19/2012  . Late prenatal care complicating pregnancy 11/19/2012   Past Medical History: Past Medical History  Diagnosis Date  . No pertinent past medical history   . Urinary tract infection     Past Surgical History: Past Surgical History  Procedure Laterality Date  . Cesarean section    . Tonsillectomy      Obstetrical History: OB History   Grav Para Term Preterm Abortions TAB SAB Ect Mult Living   2 1 1  0 0 0 0 0 0 1     Social History: History   Social History  . Marital Status: Single    Spouse Name: N/A    Number of Children: N/A  . Years of Education: N/A   Social History Main Topics  . Smoking status: Never Smoker   . Smokeless tobacco: Never Used  . Alcohol Use: No  . Drug Use: No  . Sexually Active: Not Currently   Other Topics Concern  . Not on file   Social History Narrative  . No narrative on file    Family History: Family History  Problem Relation Age of Onset  . Hypertension Mother   .  Birth defects Son     Allergies: No Known Allergies  Prescriptions prior to admission  Medication Sig Dispense Refill  . calcium carbonate (TUMS - DOSED IN MG ELEMENTAL CALCIUM) 500 MG chewable tablet Chew 2 tablets by mouth as needed for heartburn.      . Prenatal Vit-Fe Fumarate-FA (PRENATAL MULTIVITAMIN) TABS Take 1 tablet by mouth daily at 12 noon.      . cephALEXin (KEFLEX) 500 MG capsule Take 1 capsule (500 mg total) by mouth 3 (three) times daily.  21 capsule  0    Review of Systems - as per HPI  Vitals:  BP 130/80  Pulse 65  Temp(Src) 98.4 F (36.9 C) (Oral)  Resp 18  Ht 4\' 11"  (1.499 m)  Wt 82.555 kg (182 lb)  BMI 36.74 kg/m2  SpO2 98%  LMP 06/09/2012  Filed Vitals:   02/11/13 1352 02/11/13 1402 02/11/13 1412 02/11/13 1555  BP: 129/72 121/64 123/72 130/80  Pulse: 68 77 79 65  Temp:      TempSrc:      Resp:      Height:      Weight:      SpO2:        Physical Examination: Abdomen: gravid and non-tender and fundal height  is 41 cm Cervix: Evaluated by digital exam. and found to be 1.5 cm/ 75%/-2  Extremities: edema 3+ with DTRs 2-3+ knees, 2+  biceps Membranes:intact Fetal Monitoring:Baseline: 130 bpm, Variability: Good {> 6 bpm), Accelerations: Reactive and Decelerations: Absent Labs:  Results for orders placed during the hospital encounter of 02/11/13 (from the past 24 hour(s))  PROTEIN / CREATININE RATIO, URINE   Collection Time    02/11/13 12:53 PM      Result Value Range   Creatinine, Urine 590.52     Total Protein, Urine 406.3     PROTEIN CREATININE RATIO 0.69 (*) 0.00 - 0.15  CBC   Collection Time    02/11/13  1:00 PM      Result Value Range   WBC 10.3  4.0 - 10.5 K/uL   RBC 4.22  3.87 - 5.11 MIL/uL   Hemoglobin 11.8 (*) 12.0 - 15.0 g/dL   HCT 47.8 (*) 29.5 - 62.1 %   MCV 83.6  78.0 - 100.0 fL   MCH 28.0  26.0 - 34.0 pg   MCHC 33.4  30.0 - 36.0 g/dL   RDW 30.8  65.7 - 84.6 %   Platelets 190  150 - 400 K/uL  COMPREHENSIVE METABOLIC PANEL    Collection Time    02/11/13  1:00 PM      Result Value Range   Sodium 135  135 - 145 mEq/L   Potassium 4.7  3.5 - 5.1 mEq/L   Chloride 101  96 - 112 mEq/L   CO2 23  19 - 32 mEq/L   Glucose, Bld 76  70 - 99 mg/dL   BUN 12  6 - 23 mg/dL   Creatinine, Ser 9.62  0.50 - 1.10 mg/dL   Calcium 8.9  8.4 - 95.2 mg/dL   Total Protein 6.2  6.0 - 8.3 g/dL   Albumin 2.4 (*) 3.5 - 5.2 g/dL   AST 32  0 - 37 U/L   ALT 18  0 - 35 U/L   Alkaline Phosphatase 164 (*) 39 - 117 U/L   Total Bilirubin 0.4  0.3 - 1.2 mg/dL   GFR calc non Af Amer 71 (*) >90 mL/min   GFR calc Af Amer 82 (*) >90 mL/min  GLUCOSE, CAPILLARY   Collection Time    02/11/13 11:41 AM      Result Value Range   Glucose-Capillary 71  70 - 99 mg/dL  POCT URINALYSIS DIP (DEVICE)   Collection Time    02/11/13  9:57 AM      Result Value Range   Glucose, UA NEGATIVE  NEGATIVE mg/dL   Bilirubin Urine MODERATE (*) NEGATIVE   Ketones, ur TRACE (*) NEGATIVE mg/dL   Specific Gravity, Urine >=1.030  1.005 - 1.030   Hgb urine dipstick NEGATIVE  NEGATIVE   pH 6.0  5.0 - 8.0   Protein, ur >=300 (*) NEGATIVE mg/dL   Urobilinogen, UA 1.0  0.0 - 1.0 mg/dL   Nitrite NEGATIVE  NEGATIVE   Leukocytes, UA NEGATIVE  NEGATIVE     Imaging Studies:  US Fetal Bpp W/o Non Stress 8/8, normal AFI, cephalic   . docusate sodium  841 mg Oral Daily  . [START ON 02/12/2013] prenatal multivitamin  1 tablet Oral Q1200  . sodium chloride  3 mL Intravenous Q12H   I have reviewed the patient's current medications.  ASSESSMENT: Patient Active Problem List   Diagnosis Date Noted  . LGA (large for gestational age) fetus 02/11/2013  . Preeclampsia 02/11/2013  . Elevated serum creatinine 02/11/2013  . Abnormal glucose tolerance test in pregnancy, antepartum 12/18/2012  . Abnormal fetal ultrasound 12/02/2012  . Previous  cesarean delivery, antepartum condition or complication 11/19/2012  . Late prenatal care complicating pregnancy 11/19/2012    PLAN: Admit to Antenatal for observation - 24 hour urine protiein - Monitor daily CMP, CBC - elevated creatinine (1.05, baseline 0.56), normal LFTs and platelets on admission. - Fetal monitoring q shift - Induction if any severe features of preeclampsia - Prior C-section, desires TOLAC, consent signed.  Napoleon Form, MD

## 2013-02-11 NOTE — Progress Notes (Signed)
Pulse- 80  Edema- legs/feet/btwn legs  Pain- lower abd, sensitive top of stomach Pt c/o not urinating as much but drinks plenty of fluids

## 2013-02-11 NOTE — Progress Notes (Signed)
Repeat BP: New onset dipstick protein> further evaluation today. C/W Dr. Penne Lash.  Very uncomfortable with LBP and lower abd pain, similar to pain she had last week.  Mild H/A today resolved, no visual disturbance. States cx 1/60 when left hospital last wk.  Wetness in underwear, no gush. SSE: neg pool, neg fern.  Random FCBG

## 2013-02-11 NOTE — MAU Note (Signed)
Patient was seen in the [facility] and sent to MAU for evaluation of elevated blood pressure. Has some sharp pain and pressure. Denies bleeding or leaking. Reports good fetal movement.

## 2013-02-11 NOTE — Progress Notes (Signed)
Pt questioned at length about date of LMP due to excessive EFW 2 weeks ago. (7#1oz @ 33+ weeks).  Pt states that she kept her periods on a calendar, was very regular, was not on birth control, and that her LMP started on 06/09/12, was a normal period that lasted about 3 days, "just like all my other ones."  Has not bled or spotted at all since then.  Given that her earliest ultrasound was late in the 2nd trimester, we will use her LMP for dating purposed, giving her an EDC of 03/16/13, placing her at [redacted]w[redacted]d.  At this point, IOL for mild preeclampsia is not indicated.  Discussed with Dr. Penne Lash.

## 2013-02-11 NOTE — Progress Notes (Signed)
Repeat BP 145/92 CBG 71

## 2013-02-12 DIAGNOSIS — IMO0002 Reserved for concepts with insufficient information to code with codable children: Secondary | ICD-10-CM

## 2013-02-12 LAB — COMPREHENSIVE METABOLIC PANEL
ALT: 16 U/L (ref 0–35)
AST: 29 U/L (ref 0–37)
Calcium: 8.9 mg/dL (ref 8.4–10.5)
Creatinine, Ser: 1.04 mg/dL (ref 0.50–1.10)
GFR calc Af Amer: 83 mL/min — ABNORMAL LOW (ref >90)
GFR calc non Af Amer: 72 mL/min — ABNORMAL LOW (ref >90)
Glucose, Bld: 70 mg/dL (ref 70–99)
Sodium: 139 mEq/L (ref 135–145)
Total Protein: 5.1 g/dL — ABNORMAL LOW (ref 6.0–8.3)

## 2013-02-12 LAB — CREATININE CLEARANCE, URINE, 24 HOUR
Collection Interval-CRCL: 24 hours
Creatinine Clearance: 64 mL/min — ABNORMAL LOW (ref 75–115)
Creatinine, Urine: 124.16 mg/dL
Creatinine: 1.04 mg/dL (ref 0.50–1.10)

## 2013-02-12 LAB — CBC
HCT: 31 % — ABNORMAL LOW (ref 36.0–46.0)
Hemoglobin: 10.3 g/dL — ABNORMAL LOW (ref 12.0–15.0)
MCHC: 33.2 g/dL (ref 30.0–36.0)
RBC: 3.69 MIL/uL — ABNORMAL LOW (ref 3.87–5.11)

## 2013-02-12 LAB — GLUCOSE, 2 HOUR GESTATIONAL: Glucose Tolerance, 2 hour: 129 mg/dL (ref 70–164)

## 2013-02-12 NOTE — Consult Note (Addendum)
Neonatology Consult to Antenatal Patient:  I was asked by Dr. Thad Ranger to see this patient in order to provide antenatal counseling due to pre-eclampsia and possible preterm delivery.  Ms. Holly Terrell was admitted yesterday at 87 3/[redacted] weeks GA. She is currently not having active labor. She is being monitored for elevated BP and is having a 24 hour urine collected. She has been followed by MFM due to mild bilateral ventriculomegaly in the fetus, but the last ultrasound showed resolution of this finding (per the patient). The baby is a female.  I spoke with the patient alone. We discussed the worst case of delivery in the next 1-2 days, including usual DR management, possible respiratory complications and need for support, IV access, feedings (mother desires breast feeding, which was encouraged), LOS, Mortality and Morbidity, and long term outcomes. I let her know that the baby could possibly go to the central nursery, but might have respiratory and feeding issues related to being a late term preterm infant. She did not have any questions at this time. I offered a NICU tour to any interested family members and would be glad to come back if she has more questions later.  Thank you for asking me to see this patient.  Doretha Sou, MD Neonatologist  The total length of face-to-face or floor/unit time for this encounter was 25 minutes. Counseling and/or coordination of care was 20 minutes of the above.

## 2013-02-12 NOTE — Progress Notes (Signed)
Patient ID: Holly Terrell, female   DOB: 1981-05-04, 32 y.o.   MRN: [id number] FACULTY PRACTICE ANTEPARTUM COMPREHENSIVE PROGRESS NOTE  Loralye Loberg is a 32 y.o. G2P1001 at [redacted]w[redacted]d  who is admitted for preeclampsia.  Estimated Date of Delivery: 03/16/13 Fetal presentation is cephalic.  Length of Stay:  1 Days. 02/11/2013  Subjective: Pt reports no headache, vision changes or RUQ pain. Is having low back pain this morning, off and on. Patient reports good fetal movement.  She reports no uterine contractions (does have back pain), no bleeding and no loss of fluid per vagina.  Vitals:  Blood pressure 112/58, pulse 74, temperature 98.2 F (36.8 C), temperature source Oral, resp. rate 18, height 4\' 11"  (1.499 m), weight 82.555 kg (182 lb), last menstrual period 06/09/2012, SpO2 98.00%.  BP:  112-137/58-83  Physical Examination: GEN:  Alert, no acute distress CV: RRR, no murmur RESP:  CTAB ABDOMEN:  Gravid, size > dates, mild tenderness diffusely Cervical Exam: not indicated  Fetal presentation is cephalic by ultrasound yesterday Extremities: 3+ pitting edema in lower extremities to knees, esp in feet/ankles DTRs 3+ bilaterally Membranes:intact  Fetal Monitoring:  Baseline: 130 bpm, Variability: Good {> 6 bpm), Accelerations: Reactive and Decelerations: Absent TOCO:  Irregular contractions, irritability  Labs:  Results for orders placed during the hospital encounter of 02/11/13 (from the past 24 hour(s))  PROTEIN / CREATININE RATIO, URINE   Collection Time    02/11/13 12:53 PM      Result Value Range   Creatinine, Urine 590.52     Total Protein, Urine 406.3     PROTEIN CREATININE RATIO 0.69 (*) 0.00 - 0.15  CBC   Collection Time    02/11/13  1:00 PM      Result Value Range   WBC 10.3  4.0 - 10.5 K/uL   RBC 4.22  3.87 - 5.11 MIL/uL   Hemoglobin 11.8 (*) 12.0 - 15.0 g/dL   HCT 40.9 (*) 81.1 - 91.4 %   MCV 83.6  78.0 - 100.0 fL   MCH 28.0  26.0 - 34.0 pg   MCHC 33.4  30.0 - 36.0 g/dL    RDW 78.2  95.6 - 21.3 %   Platelets 190  150 - 400 K/uL  COMPREHENSIVE METABOLIC PANEL   Collection Time    02/11/13  1:00 PM      Result Value Range   Sodium 135  135 - 145 mEq/L   Potassium 4.7  3.5 - 5.1 mEq/L   Chloride 101  96 - 112 mEq/L   CO2 23  19 - 32 mEq/L   Glucose, Bld 76  70 - 99 mg/dL   BUN 12  6 - 23 mg/dL   Creatinine, Ser 0.86  0.50 - 1.10 mg/dL   Calcium 8.9  8.4 - 57.8 mg/dL   Total Protein 6.2  6.0 - 8.3 g/dL   Albumin 2.4 (*) 3.5 - 5.2 g/dL   AST 32  0 - 37 U/L   ALT 18  0 - 35 U/L   Alkaline Phosphatase 164 (*) 39 - 117 U/L   Total Bilirubin 0.4  0.3 - 1.2 mg/dL   GFR calc non Af Amer 71 (*) >90 mL/min   GFR calc Af Amer 82 (*) >90 mL/min  TYPE AND SCREEN   Collection Time    02/11/13  4:55 PM      Result Value Range   ABO/RH(D) A POS     Antibody Screen NEG     Sample Expiration  02/14/2013    CBC   Collection Time    02/12/13  5:50 AM      Result Value Range   WBC 10.0  4.0 - 10.5 K/uL   RBC 3.69 (*) 3.87 - 5.11 MIL/uL   Hemoglobin 10.3 (*) 12.0 - 15.0 g/dL   HCT 40.9 (*) 81.1 - 91.4 %   MCV 84.0  78.0 - 100.0 fL   MCH 27.9  26.0 - 34.0 pg   MCHC 33.2  30.0 - 36.0 g/dL   RDW 78.2  95.6 - 21.3 %   Platelets 153  150 - 400 K/uL  GLUCOSE, FASTING GESTATIONAL   Collection Time    02/12/13  5:50 AM      Result Value Range   Glucose, Fasting-Gestational 71    GLUCOSE, CAPILLARY   Collection Time    02/11/13 11:41 AM      Result Value Range   Glucose-Capillary 71  70 - 99 mg/dL  POCT URINALYSIS DIP (DEVICE)   Collection Time    02/11/13  9:57 AM      Result Value Range   Glucose, UA NEGATIVE  NEGATIVE mg/dL   Bilirubin Urine MODERATE (*) NEGATIVE   Ketones, ur TRACE (*) NEGATIVE mg/dL   Specific Gravity, Urine >=1.030  1.005 - 1.030   Hgb urine dipstick NEGATIVE  NEGATIVE   pH 6.0  5.0 - 8.0   Protein, ur >=300 (*) NEGATIVE mg/dL   Urobilinogen, UA 1.0  0.0 - 1.0 mg/dL   Nitrite NEGATIVE  NEGATIVE   Leukocytes, UA NEGATIVE  NEGATIVE     Intake/Output Summary (Last 24 hours) at 02/12/13 0750 Last data filed at 02/12/13 0600  Gross per 24 hour  Intake    300 ml  Output    900 ml  Net   -600 ml     Imaging Studies:    BPP 8/8 yesterday, cephalic, normal AFI GROWTH 7lb 1 oz at [redacted]w[redacted]d (>90%) on ultrasound 4/23.  Medications:  Scheduled . docusate sodium  100 mg Oral Daily  . prenatal multivitamin  1 tablet Oral Q1200  . sodium chloride  3 mL Intravenous Q12H   I have reviewed the patient's current medications.  ASSESSMENT: Patient Active Problem List   Diagnosis Date Noted  . LGA (large for gestational age) fetus 02/11/2013  . Preeclampsia 02/11/2013  . Elevated serum creatinine 02/11/2013  . Abnormal glucose tolerance test in pregnancy, antepartum 12/18/2012  . Abnormal fetal ultrasound 12/02/2012  . Previous cesarean delivery, antepartum condition or complication 11/19/2012  . Late prenatal care complicating pregnancy 11/19/2012    PLAN: Continue 24 hour urine protein, monitor creatinine. Poor urine output overnight. States LMP is certain but measuring very big for dates - 3 hour GTT today Continue routine antenatal care. Induction if any severe features (creatinine > 1.1, BP > 160/110  Napoleon Form, MD

## 2013-02-13 LAB — CBC
HCT: 31.3 % — ABNORMAL LOW (ref 36.0–46.0)
Hemoglobin: 10.2 g/dL — ABNORMAL LOW (ref 12.0–15.0)
MCH: 27.5 pg (ref 26.0–34.0)
RBC: 3.71 MIL/uL — ABNORMAL LOW (ref 3.87–5.11)

## 2013-02-13 LAB — COMPREHENSIVE METABOLIC PANEL
ALT: 14 U/L (ref 0–35)
AST: 25 U/L (ref 0–37)
Albumin: 1.7 g/dL — ABNORMAL LOW (ref 3.5–5.2)
Alkaline Phosphatase: 136 U/L — ABNORMAL HIGH (ref 39–117)
BUN: 14 mg/dL (ref 6–23)
CO2: 24 mEq/L (ref 19–32)
Calcium: 8 mg/dL — ABNORMAL LOW (ref 8.4–10.5)
Chloride: 106 mEq/L (ref 96–112)
Creatinine, Ser: 0.97 mg/dL (ref 0.50–1.10)
GFR calc Af Amer: >90 mL/min (ref >90)
GFR calc non Af Amer: 78 mL/min — ABNORMAL LOW (ref >90)
Glucose, Bld: 69 mg/dL — ABNORMAL LOW (ref 70–99)
Potassium: 4.3 mEq/L (ref 3.5–5.1)
Sodium: 138 mEq/L (ref 135–145)
Total Bilirubin: 0.2 mg/dL — ABNORMAL LOW (ref 0.3–1.2)
Total Protein: 4.5 g/dL — ABNORMAL LOW (ref 6.0–8.3)

## 2013-02-13 NOTE — Progress Notes (Signed)
Patient ID: Holly Terrell, female   DOB: [birth year], 32 y.o.   MRN: [id number] FACULTY PRACTICE ANTEPARTUM COMPREHENSIVE PROGRESS NOTE  Holly Terrell is a 32 y.o. G2P1001 at [redacted]w[redacted]d  who is admitted for Preeclampsia.  Estimated Date of Delivery: 03/16/13 Fetal presentation is cephalic.  Length of Stay:  2 Days. 02/11/2013  Subjective: Pt denies headache, vision changes, RUQ pain. Patient reports good fetal movement.  She reports no uterine contractions, no bleeding and no loss of fluid per vagina.  Vitals:  Blood pressure 129/78, pulse 75, temperature 98.2 F (36.8 C), temperature source Oral, resp. rate 18, height 4\' 11"  (1.499 m), weight 82.963 kg (182 lb 14.4 oz), last menstrual period 06/09/2012, SpO2 98.00%.  Filed Vitals:   02/12/13 1950 02/12/13 2153 02/12/13 2323 02/13/13 0634  BP: 145/87 142/91 140/84 129/78  Pulse: 67 70 66 75  Temp: 98.2 F (36.8 C) 98.1 F (36.7 C)  98.2 F (36.8 C)  TempSrc: Oral Oral  Oral  Resp: 18 18 18 18   Height:      Weight:      SpO2:        Physical Examination: GEN:  Alert, no distress CV:  RRR, no murmur RESP: CTAB ABD:  Gravid, size>dates, non-tender, no guarding or rebound Cervical Exam: Not evaluated. Extremities: edema 3+ lower extremities, tender with DTRs 3+ lower, 2+ upper Membranes:intact  Fetal Monitoring:  Baseline: 130 bpm, Variability: Good {> 6 bpm), Accelerations: Reactive, Decelerations: Absent and irregular contractions  Labs:  Results for orders placed during the hospital encounter of 02/11/13 (from the past 72 hour(s))  PROTEIN / CREATININE RATIO, URINE     Status: Abnormal   Collection Time    02/11/13 12:53 PM      Result Value Range   Creatinine, Urine 590.52     Total Protein, Urine 406.3     Comment: NO NORMAL RANGE ESTABLISHED FOR THIS TEST   PROTEIN CREATININE RATIO 0.69 (*) 0.00 - 0.15  CBC     Status: Abnormal   Collection Time    02/11/13  1:00 PM      Result Value Range   WBC 10.3  4.0 - 10.5 K/uL   RBC  4.22  3.87 - 5.11 MIL/uL   Hemoglobin 11.8 (*) 12.0 - 15.0 g/dL   HCT 40.9 (*) 81.1 - 91.4 %   MCV 83.6  78.0 - 100.0 fL   MCH 28.0  26.0 - 34.0 pg   MCHC 33.4  30.0 - 36.0 g/dL   RDW 78.2  95.6 - 21.3 %   Platelets 190  150 - 400 K/uL  COMPREHENSIVE METABOLIC PANEL     Status: Abnormal   Collection Time    02/11/13  1:00 PM      Result Value Range   Sodium 135  135 - 145 mEq/L   Potassium 4.7  3.5 - 5.1 mEq/L   Chloride 101  96 - 112 mEq/L   CO2 23  19 - 32 mEq/L   Glucose, Bld 76  70 - 99 mg/dL   BUN 12  6 - 23 mg/dL   Creatinine, Ser 0.86  0.50 - 1.10 mg/dL   Calcium 8.9  8.4 - 57.8 mg/dL   Total Protein 6.2  6.0 - 8.3 g/dL   Albumin 2.4 (*) 3.5 - 5.2 g/dL   AST 32  0 - 37 U/L   ALT 18  0 - 35 U/L   Alkaline Phosphatase 164 (*) 39 - 117 U/L   Total Bilirubin 0.4  0.3 - 1.2 mg/dL   GFR calc non Af Amer 71 (*) >90 mL/min   GFR calc Af Amer 82 (*) >90 mL/min   Comment:            The eGFR has been calculated     using the CKD EPI equation.     This calculation has not been     validated in all clinical     situations.     eGFR's persistently     <90 mL/min signify     possible Chronic Kidney Disease.  PROTEIN, URINE, 24 HOUR     Status: Abnormal   Collection Time    02/11/13  4:30 PM      Result Value Range   Urine Total Volume-UPROT 775     Collection Interval-UPROT 24     Protein, Urine 88     Protein, 24H Urine 682 (*) 50 - 100 mg/day  CREATININE CLEARANCE, URINE, 24 HOUR     Status: Abnormal   Collection Time    02/11/13  4:30 PM      Result Value Range   Urine Total Volume-CRCL 775     Collection Interval-CRCL 24     Creatinine, Urine 124.16     Creatinine 1.04  0.50 - 1.10 mg/dL   Creatinine, 16X Ur 096  700 - 1800 mg/day   Creatinine Clearance 64 (*) 75 - 115 mL/min  TYPE AND SCREEN     Status: None   Collection Time    02/11/13  4:55 PM      Result Value Range   ABO/RH(D) A POS     Antibody Screen NEG     Sample Expiration 02/14/2013     COMPREHENSIVE METABOLIC PANEL     Status: Abnormal   Collection Time    02/12/13  5:50 AM      Result Value Range   Sodium 139  135 - 145 mEq/L   Potassium 4.7  3.5 - 5.1 mEq/L   Chloride 107  96 - 112 mEq/L   CO2 24  19 - 32 mEq/L   Glucose, Bld 70  70 - 99 mg/dL   BUN 13  6 - 23 mg/dL   Creatinine, Ser 0.45  0.50 - 1.10 mg/dL   Calcium 8.9  8.4 - 40.9 mg/dL   Total Protein 5.1 (*) 6.0 - 8.3 g/dL   Albumin 1.8 (*) 3.5 - 5.2 g/dL   AST 29  0 - 37 U/L   ALT 16  0 - 35 U/L   Alkaline Phosphatase 136 (*) 39 - 117 U/L   Total Bilirubin 0.3  0.3 - 1.2 mg/dL   GFR calc non Af Amer 72 (*) >90 mL/min   GFR calc Af Amer 83 (*) >90 mL/min   Comment:            The eGFR has been calculated     using the CKD EPI equation.     This calculation has not been     validated in all clinical     situations.     eGFR's persistently     <90 mL/min signify     possible Chronic Kidney Disease.  CBC     Status: Abnormal   Collection Time    02/12/13  5:50 AM      Result Value Range   WBC 10.0  4.0 - 10.5 K/uL   RBC 3.69 (*) 3.87 - 5.11 MIL/uL   Hemoglobin 10.3 (*) 12.0 - 15.0 g/dL  HCT 31.0 (*) 36.0 - 46.0 %   MCV 84.0  78.0 - 100.0 fL   MCH 27.9  26.0 - 34.0 pg   MCHC 33.2  30.0 - 36.0 g/dL   RDW 96.0  45.4 - 09.8 %   Platelets 153  150 - 400 K/uL  GLUCOSE, FASTING GESTATIONAL     Status: None   Collection Time    02/12/13  5:50 AM      Result Value Range   Glucose, Fasting-Gestational 71    GLUCOSE, 1 HOUR GESTATIONAL     Status: None   Collection Time    02/12/13  9:10 AM      Result Value Range   Glucose, 1 Hour-Gestational 144  70 - 189 mg/dL  GLUCOSE, 2 HOUR GESTATIONAL     Status: None   Collection Time    02/12/13  9:57 AM      Result Value Range   Glucose, 2 Hour-Gestational 129  70 - 164 mg/dL  GLUCOSE, 3 HOUR GESTATIONAL     Status: None   Collection Time    02/12/13 11:00 AM      Result Value Range   Glucose, GTT - 3 Hour 99  70 - 144 mg/dL  CBC     Status:  Abnormal   Collection Time    02/13/13  5:50 AM      Result Value Range   WBC 10.8 (*) 4.0 - 10.5 K/uL   RBC 3.71 (*) 3.87 - 5.11 MIL/uL   Hemoglobin 10.2 (*) 12.0 - 15.0 g/dL   HCT 11.9 (*) 14.7 - 82.9 %   MCV 84.4  78.0 - 100.0 fL   MCH 27.5  26.0 - 34.0 pg   MCHC 32.6  30.0 - 36.0 g/dL   RDW 56.2  13.0 - 86.5 %   Platelets 151  150 - 400 K/uL  COMPREHENSIVE METABOLIC PANEL     Status: Abnormal   Collection Time    02/13/13  5:50 AM      Result Value Range   Sodium 138  135 - 145 mEq/L   Potassium 4.3  3.5 - 5.1 mEq/L   Chloride 106  96 - 112 mEq/L   CO2 24  19 - 32 mEq/L   Glucose, Bld 69 (*) 70 - 99 mg/dL   BUN 14  6 - 23 mg/dL   Creatinine, Ser 7.84  0.50 - 1.10 mg/dL   Calcium 8.0 (*) 8.4 - 10.5 mg/dL   Total Protein 4.5 (*) 6.0 - 8.3 g/dL   Albumin 1.7 (*) 3.5 - 5.2 g/dL   AST 25  0 - 37 U/L   ALT 14  0 - 35 U/L   Alkaline Phosphatase 136 (*) 39 - 117 U/L   Total Bilirubin 0.2 (*) 0.3 - 1.2 mg/dL   GFR calc non Af Amer 78 (*) >90 mL/min   GFR calc Af Amer >90  >90 mL/min   Comment:            The eGFR has been calculated     using the CKD EPI equation.     This calculation has not been     validated in all clinical     situations.     eGFR's persistently     <90 mL/min signify     possible Chronic Kidney Disease.    Imaging Studies:    No new ultrasounds   Medications:  Scheduled . docusate sodium  100 mg Oral Daily  . prenatal  multivitamin  1 tablet Oral Q1200  . sodium chloride  3 mL Intravenous Q12H   I have reviewed the patient's current medications.  ASSESSMENT: Patient Active Problem List   Diagnosis Date Noted  . LGA (large for gestational age) fetus 02/11/2013  . Preeclampsia 02/11/2013  . Elevated serum creatinine 02/11/2013  . Abnormal glucose tolerance test in pregnancy, antepartum 12/18/2012  . Abnormal fetal ultrasound 12/02/2012  . Previous cesarean delivery, antepartum condition or complication 11/19/2012  . Late prenatal care  complicating pregnancy 11/19/2012    PLAN: - 24 hour urine protein:  682, total urine 775 mg in 24 hours. Continue strict Is and Os. - BP normal to 140s/80-90s - Creatinine improved: 0.97 (from 1.06), LFTs normal. Platelets 151 (from 190). - Continue routine antenatal care. - Discuss with MFM today - criteria for delivery - Desires TOLAC  Napoleon Form, MD

## 2013-02-13 NOTE — Progress Notes (Signed)
Dr. Thad Ranger at bedside pt requesting to go home  Orders

## 2013-02-13 NOTE — Progress Notes (Signed)
Patient ID: Holly Terrell, female   DOB: 05/29/1981, 32 y.o.   MRN: [id number] Dr. Jolayne Panther notified of 24 hour urine 682. BP stable. Plans MFM consult in am.   Dorathy Kinsman, CNM 02/13/2013 1:49 AM

## 2013-02-13 NOTE — Progress Notes (Signed)
UR completed 

## 2013-02-13 NOTE — Discharge Summary (Signed)
Antenatal Physician Discharge Summary  Patient ID: Holly Terrell MRN: [id number] DOB/AGE: 03-25-1981 32 y.o.  Admit date: 02/11/2013 Discharge date: 02/13/2013  Admission Diagnoses: Gestational hypertension with superimposed preeclampsia, IUP at [redacted]w[redacted]d, previous cesarean section  Discharge Diagnoses: same  Prenatal Procedures: NST, Preeclampsia and ultrasound  Significant Diagnostic Studies:  Results for orders placed during the hospital encounter of 02/11/13 (from the past 168 hour(s))  PROTEIN / CREATININE RATIO, URINE   Collection Time    02/11/13 12:53 PM      Result Value Range   Creatinine, Urine 590.52     Total Protein, Urine 406.3     PROTEIN CREATININE RATIO 0.69 (*) 0.00 - 0.15  CBC   Collection Time    02/11/13  1:00 PM      Result Value Range   WBC 10.3  4.0 - 10.5 K/uL   RBC 4.22  3.87 - 5.11 MIL/uL   Hemoglobin 11.8 (*) 12.0 - 15.0 g/dL   HCT 78.2 (*) 95.6 - 21.3 %   MCV 83.6  78.0 - 100.0 fL   MCH 28.0  26.0 - 34.0 pg   MCHC 33.4  30.0 - 36.0 g/dL   RDW 08.6  57.8 - 46.9 %   Platelets 190  150 - 400 K/uL  COMPREHENSIVE METABOLIC PANEL   Collection Time    02/11/13  1:00 PM      Result Value Range   Sodium 135  135 - 145 mEq/L   Potassium 4.7  3.5 - 5.1 mEq/L   Chloride 101  96 - 112 mEq/L   CO2 23  19 - 32 mEq/L   Glucose, Bld 76  70 - 99 mg/dL   BUN 12  6 - 23 mg/dL   Creatinine, Ser 6.29  0.50 - 1.10 mg/dL   Calcium 8.9  8.4 - 52.8 mg/dL   Total Protein 6.2  6.0 - 8.3 g/dL   Albumin 2.4 (*) 3.5 - 5.2 g/dL   AST 32  0 - 37 U/L   ALT 18  0 - 35 U/L   Alkaline Phosphatase 164 (*) 39 - 117 U/L   Total Bilirubin 0.4  0.3 - 1.2 mg/dL   GFR calc non Af Amer 71 (*) >90 mL/min   GFR calc Af Amer 82 (*) >90 mL/min  PROTEIN, URINE, 24 HOUR   Collection Time    02/11/13  4:30 PM      Result Value Range   Urine Total Volume-UPROT 775     Collection Interval-UPROT 24     Protein, Urine 88     Protein, 24H Urine 682 (*) 50 - 100 mg/day  CREATININE  CLEARANCE, URINE, 24 HOUR   Collection Time    02/11/13  4:30 PM      Result Value Range   Urine Total Volume-CRCL 775     Collection Interval-CRCL 24     Creatinine, Urine 124.16     Creatinine 1.04  0.50 - 1.10 mg/dL   Creatinine, 41L Ur 244  700 - 1800 mg/day   Creatinine Clearance 64 (*) 75 - 115 mL/min  TYPE AND SCREEN   Collection Time    02/11/13  4:55 PM      Result Value Range   ABO/RH(D) A POS     Antibody Screen NEG     Sample Expiration 02/14/2013    COMPREHENSIVE METABOLIC PANEL   Collection Time    02/12/13  5:50 AM      Result Value Range   Sodium 139  135 - 145 mEq/L   Potassium 4.7  3.5 - 5.1 mEq/L   Chloride 107  96 - 112 mEq/L   CO2 24  19 - 32 mEq/L   Glucose, Bld 70  70 - 99 mg/dL   BUN 13  6 - 23 mg/dL   Creatinine, Ser 1.61  0.50 - 1.10 mg/dL   Calcium 8.9  8.4 - 09.6 mg/dL   Total Protein 5.1 (*) 6.0 - 8.3 g/dL   Albumin 1.8 (*) 3.5 - 5.2 g/dL   AST 29  0 - 37 U/L   ALT 16  0 - 35 U/L   Alkaline Phosphatase 136 (*) 39 - 117 U/L   Total Bilirubin 0.3  0.3 - 1.2 mg/dL   GFR calc non Af Amer 72 (*) >90 mL/min   GFR calc Af Amer 83 (*) >90 mL/min  CBC   Collection Time    02/12/13  5:50 AM      Result Value Range   WBC 10.0  4.0 - 10.5 K/uL   RBC 3.69 (*) 3.87 - 5.11 MIL/uL   Hemoglobin 10.3 (*) 12.0 - 15.0 g/dL   HCT 04.5 (*) 40.9 - 81.1 %   MCV 84.0  78.0 - 100.0 fL   MCH 27.9  26.0 - 34.0 pg   MCHC 33.2  30.0 - 36.0 g/dL   RDW 91.4  78.2 - 95.6 %   Platelets 153  150 - 400 K/uL  GLUCOSE, FASTING GESTATIONAL   Collection Time    02/12/13  5:50 AM      Result Value Range   Glucose, Fasting-Gestational 71    GLUCOSE, 1 HOUR GESTATIONAL   Collection Time    02/12/13  9:10 AM      Result Value Range   Glucose, 1 Hour-Gestational 144  70 - 189 mg/dL  GLUCOSE, 2 HOUR GESTATIONAL   Collection Time    02/12/13  9:57 AM      Result Value Range   Glucose, 2 Hour-Gestational 129  70 - 164 mg/dL  GLUCOSE, 3 HOUR GESTATIONAL   Collection Time     02/12/13 11:00 AM      Result Value Range   Glucose, GTT - 3 Hour 99  70 - 144 mg/dL  CBC   Collection Time    02/13/13  5:50 AM      Result Value Range   WBC 10.8 (*) 4.0 - 10.5 K/uL   RBC 3.71 (*) 3.87 - 5.11 MIL/uL   Hemoglobin 10.2 (*) 12.0 - 15.0 g/dL   HCT 21.3 (*) 08.6 - 57.8 %   MCV 84.4  78.0 - 100.0 fL   MCH 27.5  26.0 - 34.0 pg   MCHC 32.6  30.0 - 36.0 g/dL   RDW 46.9  62.9 - 52.8 %   Platelets 151  150 - 400 K/uL  COMPREHENSIVE METABOLIC PANEL   Collection Time    02/13/13  5:50 AM      Result Value Range   Sodium 138  135 - 145 mEq/L   Potassium 4.3  3.5 - 5.1 mEq/L   Chloride 106  96 - 112 mEq/L   CO2 24  19 - 32 mEq/L   Glucose, Bld 69 (*) 70 - 99 mg/dL   BUN 14  6 - 23 mg/dL   Creatinine, Ser 4.13  0.50 - 1.10 mg/dL   Calcium 8.0 (*) 8.4 - 10.5 mg/dL   Total Protein 4.5 (*) 6.0 - 8.3 g/dL   Albumin 1.7 (*)  3.5 - 5.2 g/dL   AST 25  0 - 37 U/L   ALT 14  0 - 35 U/L   Alkaline Phosphatase 136 (*) 39 - 117 U/L   Total Bilirubin 0.2 (*) 0.3 - 1.2 mg/dL   GFR calc non Af Amer 78 (*) >90 mL/min   GFR calc Af Amer >90  >90 mL/min  GLUCOSE, CAPILLARY   Collection Time    02/11/13 11:41 AM      Result Value Range   Glucose-Capillary 71  70 - 99 mg/dL  POCT URINALYSIS DIP (DEVICE)   Collection Time    02/11/13  9:57 AM      Result Value Range   Glucose, UA NEGATIVE  NEGATIVE mg/dL   Bilirubin Urine MODERATE (*) NEGATIVE   Ketones, ur TRACE (*) NEGATIVE mg/dL   Specific Gravity, Urine >=1.030  1.005 - 1.030   Hgb urine dipstick NEGATIVE  NEGATIVE   pH 6.0  5.0 - 8.0   Protein, ur >=300 (*) NEGATIVE mg/dL   Urobilinogen, UA 1.0  0.0 - 1.0 mg/dL   Nitrite NEGATIVE  NEGATIVE   Leukocytes, UA NEGATIVE  NEGATIVE   Imaging:  Limited Ultrasound : cephalic, normal AFI, BPP 8/8  Hospital Course:  This is a 32 y.o. G2P1001 with IUP at [redacted]w[redacted]d admitted for gestational hypertension with superimposed preeclampsia. She had blood pressures of 142/73 and 145/92 on two  separate office visits. On day of admission, her urine dip showed >300 protein, which was an increase from prior visits. She was seen in [facility]U and [facility] labs showed elevated creatinine (1.05, up from 0.56 previously), urine protein:creatinine ratio of 0.69 and normal LFTs and platelets. BPP showed normal AFI and 8/8.  She was admitted for preeclampsia work-up.  24 hour urine volume was only 775 ml with 682 mg protein. She had no headache or vision changes, and her blood pressure was mostly normal (110s-130s/60-80s) with 3 BP in the 140s/80-90s.  She had no contractions, vaginal bleeding or loss of fluid. NSTs were reactive throughout the hospital stay, and fetal movement was normal. She was not treated with antihypertensive medications or magnesium.   After discussion with Maternal Fetal Medicine, patient was deemed stable to discharge home with close follow-up. She had no severe features of preeclampsia but will be followed in clinic on Monday 5/5 with NST, blood pressure check and labs. If she develops any severe features: blood pressures > 160/110, headache, vision changes, oliguria, or creatinine >1.1, she will be admitted for induction of labor. Otherwise, she will be induced at 37 weeks. The patient has a prior cesarean section for breech and desires TOLAC. Her pregnancy dating was reviewed and EDD of 03/16/13 was deemed to be appropriate based on patient's LMP (she is adamant about date of period, is regular, keeps track of menstrual cycles and was not on birth control). Only other dating is 25 week ultrasound that put EDD 10 days ahead of LMP but not sufficiently different to change dating.   Discharge Exam: BP 127/80  Pulse 80  Temp(Src) 98 F (36.7 C) (Oral)  Resp 18  Ht 4\' 11"  (1.499 m)  Wt 83.734 kg (184 lb 9.6 oz)  BMI 37.26 kg/m2  SpO2 98%  LMP 06/09/2012  Physical Examination:  GEN: Alert, no distress  CV: RRR, no murmur  RESP: CTAB  ABD: Gravid, size>dates, non-tender, no guarding or  rebound  Cervical Exam: Not evaluated.  Extremities: edema 3+ lower extremities, tender with DTRs 3+ lower, 2+ upper  Membranes:intact   Fetal Monitoring: Baseline: 130 bpm, Variability: Good {> 6 bpm), Accelerations: Reactive, Decelerations: Absent and irregular contractions  Discharge Condition: stable  Disposition: 01-Home or Self Care  Discharge Orders   Future Appointments Provider Department Dept Phone   02/16/2013 2:00 PM [facility] [phone]   02/19/2013 8:30 AM [facility] [phone]   Future Orders Complete By Expires     Discharge patient  As directed     Comments:      To home        Medication List    TAKE these medications       calcium carbonate 500 MG chewable tablet  Commonly known as:  TUMS - dosed in mg elemental calcium  Chew 2 tablets by mouth as needed for heartburn.     cephALEXin 500 MG capsule  Commonly known as:  KEFLEX  Take 1 capsule (500 mg total) by mouth 3 (three) times daily.     prenatal multivitamin Tabs  Take 1 tablet by mouth daily at 12 noon.           Follow-up Information   Follow up with [facility] On 02/16/2013. (2:00 PM for monitoring (NST), labs)    Contact information:   [address] [place] [address] [phone]      Follow up with [facility] On 02/19/2013. (8:30 am for prenatal visit, monitoring (NST) and labs)    Contact information:   [address] [place] [address] [phone]      Signed: Napoleon Form M.D. 02/13/2013, 12:29 PM

## 2013-02-13 NOTE — Progress Notes (Signed)
Holly Terrell, CNM called notified pt's IV came out today, orders received that pt does not need IV at this time

## 2013-02-15 ENCOUNTER — Inpatient Hospital Stay (HOSPITAL_COMMUNITY)
Admission: AD | Admit: 2013-02-15 | Discharge: 2013-02-19 | DRG: 765 | Disposition: A | Discharge status: Home / Self Care | Payer: Medicaid Other | Source: Ambulatory Visit | Attending: Obstetrics and Gynecology | Admitting: Obstetrics and Gynecology

## 2013-02-15 ENCOUNTER — Encounter (HOSPITAL_COMMUNITY): Payer: Self-pay | Admitting: *Deleted

## 2013-02-15 DIAGNOSIS — O34219 Maternal care for unspecified type scar from previous cesarean delivery: Secondary | ICD-10-CM | POA: Diagnosis present

## 2013-02-15 DIAGNOSIS — Z98891 History of uterine scar from previous surgery: Secondary | ICD-10-CM

## 2013-02-15 DIAGNOSIS — O3660X0 Maternal care for excessive fetal growth, unspecified trimester, not applicable or unspecified: Secondary | ICD-10-CM | POA: Diagnosis present

## 2013-02-15 DIAGNOSIS — O1410 Severe pre-eclampsia, unspecified trimester: Secondary | ICD-10-CM

## 2013-02-15 DIAGNOSIS — O321XX0 Maternal care for breech presentation, not applicable or unspecified: Secondary | ICD-10-CM | POA: Diagnosis present

## 2013-02-15 DIAGNOSIS — O1493 Unspecified pre-eclampsia, third trimester: Secondary | ICD-10-CM

## 2013-02-15 DIAGNOSIS — O43899 Other placental disorders, unspecified trimester: Secondary | ICD-10-CM | POA: Diagnosis present

## 2013-02-15 DIAGNOSIS — O99810 Abnormal glucose complicating pregnancy: Secondary | ICD-10-CM

## 2013-02-15 DIAGNOSIS — O1414 Severe pre-eclampsia complicating childbirth: Principal | ICD-10-CM | POA: Diagnosis present

## 2013-02-15 DIAGNOSIS — O339 Maternal care for disproportion, unspecified: Secondary | ICD-10-CM | POA: Diagnosis not present

## 2013-02-15 DIAGNOSIS — O4290 Premature rupture of membranes, unspecified as to length of time between rupture and onset of labor, unspecified weeks of gestation: Secondary | ICD-10-CM | POA: Diagnosis present

## 2013-02-15 HISTORY — DX: Gestational (pregnancy-induced) hypertension without significant proteinuria, unspecified trimester: O13.9

## 2013-02-15 HISTORY — DX: Other specified health status: Z78.9

## 2013-02-15 LAB — COMPREHENSIVE METABOLIC PANEL
ALT: 14 U/L (ref 0–35)
AST: 27 U/L (ref 0–37)
Albumin: 1.9 g/dL — ABNORMAL LOW (ref 3.5–5.2)
Alkaline Phosphatase: 159 U/L — ABNORMAL HIGH (ref 39–117)
Potassium: 4.6 mEq/L (ref 3.5–5.1)
Sodium: 134 mEq/L — ABNORMAL LOW (ref 135–145)
Total Protein: 5.2 g/dL — ABNORMAL LOW (ref 6.0–8.3)

## 2013-02-15 LAB — CBC
HCT: 32.9 % — ABNORMAL LOW (ref 36.0–46.0)
Hemoglobin: 11.1 g/dL — ABNORMAL LOW (ref 12.0–15.0)
Hemoglobin: 11.1 g/dL — ABNORMAL LOW (ref 12.0–15.0)
Hemoglobin: 10.8 g/dL — ABNORMAL LOW (ref 12.0–15.0)
MCH: 27.6 pg (ref 26.0–34.0)
MCHC: 33.1 g/dL (ref 30.0–36.0)
MCV: 84 fL (ref 78.0–100.0)
Platelets: 214 10*3/uL (ref 150–400)
RBC: 3.93 MIL/uL (ref 3.87–5.11)
RBC: 3.92 MIL/uL (ref 3.87–5.11)
RDW: 15.4 % (ref 11.5–15.5)
WBC: 13.3 10*3/uL — ABNORMAL HIGH (ref 4.0–10.5)

## 2013-02-15 LAB — BASIC METABOLIC PANEL
CO2: 19 mEq/L (ref 19–32)
Calcium: 8.4 mg/dL (ref 8.4–10.5)
Glucose, Bld: 100 mg/dL — ABNORMAL HIGH (ref 70–99)
Potassium: 4.4 mEq/L (ref 3.5–5.1)
Sodium: 129 mEq/L — ABNORMAL LOW (ref 135–145)

## 2013-02-15 LAB — URINALYSIS, ROUTINE W REFLEX MICROSCOPIC
Glucose, UA: NEGATIVE mg/dL
Ketones, ur: 15 mg/dL — AB
Leukocytes, UA: NEGATIVE
pH: 6 (ref 5.0–8.0)

## 2013-02-15 LAB — RPR: RPR Ser Ql: NONREACTIVE

## 2013-02-15 MED ORDER — OXYCODONE-ACETAMINOPHEN 5-325 MG PO TABS: 1.0000 | ORAL_TABLET | ORAL | Status: DC | PRN

## 2013-02-15 MED ORDER — OXYTOCIN 40 UNITS IN LACTATED RINGERS INFUSION - SIMPLE MED: 1.0000 m[IU]/min | INTRAVENOUS | Status: DC

## 2013-02-15 MED ORDER — LIDOCAINE HCL (PF) 1 % IJ SOLN
INTRAMUSCULAR | Status: DC | PRN
  Administered 2013-02-15 (×2): 8 mL

## 2013-02-15 MED ORDER — OXYTOCIN 40 UNITS IN LACTATED RINGERS INFUSION - SIMPLE MED
1.0000 m[IU]/min | INTRAVENOUS | Status: DC
  Administered 2013-02-15: 1 m[IU]/min via INTRAVENOUS
  Administered 2013-02-15: 7 m[IU]/min via INTRAVENOUS
  Administered 2013-02-15: 9 m[IU]/min via INTRAVENOUS
  Filled 2013-02-15: qty 1000

## 2013-02-15 MED ORDER — MAGNESIUM SULFATE 40 G IN LACTATED RINGERS - SIMPLE
1.0000 g/h | INTRAVENOUS | Status: DC
  Administered 2013-02-15: 4 g/h via INTRAVENOUS
  Filled 2013-02-15: qty 500

## 2013-02-15 MED ORDER — IBUPROFEN 600 MG PO TABS: 600.0000 mg | ORAL_TABLET | Freq: Four times a day (QID) | ORAL | Status: DC | PRN

## 2013-02-15 MED ORDER — OXYTOCIN BOLUS FROM INFUSION: 500.0000 mL | INTRAVENOUS | Status: DC

## 2013-02-15 MED ORDER — ZOLPIDEM TARTRATE 5 MG PO TABS: 5.0000 mg | ORAL_TABLET | Freq: Every evening | ORAL | Status: DC | PRN

## 2013-02-15 MED ORDER — MAGNESIUM SULFATE BOLUS VIA INFUSION
4.0000 g | Freq: Once | INTRAVENOUS | Status: DC
  Filled 2013-02-15: qty 500

## 2013-02-15 MED ORDER — LACTATED RINGERS IV SOLN: 500.0000 mL | Freq: Once | INTRAVENOUS | Status: DC

## 2013-02-15 MED ORDER — OXYTOCIN 40 UNITS IN LACTATED RINGERS INFUSION - SIMPLE MED: 62.5000 mL/h | INTRAVENOUS | Status: DC

## 2013-02-15 MED ORDER — PHENYLEPHRINE 40 MCG/ML (10ML) SYRINGE FOR IV PUSH (FOR BLOOD PRESSURE SUPPORT): 80.0000 ug | PREFILLED_SYRINGE | INTRAVENOUS | Status: DC | PRN

## 2013-02-15 MED ORDER — FENTANYL 2.5 MCG/ML BUPIVACAINE 1/10 % EPIDURAL INFUSION (WH - ANES)
14.0000 mL/h | INTRAMUSCULAR | Status: DC | PRN
  Administered 2013-02-15: 14 mL/h via EPIDURAL
  Filled 2013-02-15 (×2): qty 125

## 2013-02-15 MED ORDER — FENTANYL 2.5 MCG/ML BUPIVACAINE 1/10 % EPIDURAL INFUSION (WH - ANES)
INTRAMUSCULAR | Status: DC | PRN
  Administered 2013-02-15: 14 mL/h via EPIDURAL

## 2013-02-15 MED ORDER — ONDANSETRON HCL 4 MG/2ML IJ SOLN: 4.0000 mg | Freq: Four times a day (QID) | INTRAMUSCULAR | Status: DC | PRN

## 2013-02-15 MED ORDER — EPHEDRINE 5 MG/ML INJ: 10.0000 mg | INTRAVENOUS | Status: DC | PRN

## 2013-02-15 MED ORDER — CITRIC ACID-SODIUM CITRATE 334-500 MG/5ML PO SOLN
30.0000 mL | ORAL | Status: DC | PRN
  Administered 2013-02-16 (×2): 30 mL via ORAL
  Filled 2013-02-15: qty 15

## 2013-02-15 MED ORDER — LIDOCAINE HCL (PF) 1 % IJ SOLN: 30.0000 mL | INTRAMUSCULAR | Status: DC | PRN

## 2013-02-15 MED ORDER — LACTATED RINGERS IV BOLUS (SEPSIS)
500.0000 mL | Freq: Once | INTRAVENOUS | Status: AC
  Administered 2013-02-15: 500 mL via INTRAVENOUS

## 2013-02-15 MED ORDER — ACETAMINOPHEN 325 MG PO TABS
650.0000 mg | ORAL_TABLET | ORAL | Status: DC | PRN
  Administered 2013-02-15: 650 mg via ORAL
  Filled 2013-02-15: qty 1

## 2013-02-15 MED ORDER — LACTATED RINGERS IV SOLN
500.0000 mL | INTRAVENOUS | Status: DC | PRN
  Administered 2013-02-15: 250 mL via INTRAVENOUS
  Administered 2013-02-15 – 2013-02-16 (×2): 500 mL via INTRAVENOUS

## 2013-02-15 MED ORDER — FENTANYL CITRATE 0.05 MG/ML IJ SOLN
50.0000 ug | INTRAMUSCULAR | Status: DC | PRN
  Administered 2013-02-15: 50 ug via INTRAVENOUS
  Filled 2013-02-15: qty 2

## 2013-02-15 MED ORDER — EPHEDRINE 5 MG/ML INJ
10.0000 mg | INTRAVENOUS | Status: DC | PRN
  Filled 2013-02-15: qty 4

## 2013-02-15 MED ORDER — LACTATED RINGERS IV SOLN
INTRAVENOUS | Status: DC
  Administered 2013-02-15 – 2013-02-16 (×7): via INTRAVENOUS

## 2013-02-15 MED ORDER — DIPHENHYDRAMINE HCL 50 MG/ML IJ SOLN: 12.5000 mg | INTRAMUSCULAR | Status: DC | PRN

## 2013-02-15 MED ORDER — PHENYLEPHRINE 40 MCG/ML (10ML) SYRINGE FOR IV PUSH (FOR BLOOD PRESSURE SUPPORT)
80.0000 ug | PREFILLED_SYRINGE | INTRAVENOUS | Status: DC | PRN
  Filled 2013-02-15: qty 5

## 2013-02-15 NOTE — H&P (Signed)
Agree with IOL for PPROM 35+ wkls with Magnesium sulfate seizure prophylaxis due to severe criteria BP's

## 2013-02-15 NOTE — Progress Notes (Signed)
Holly Terrell is a 32 y.o. G2P1001 at [redacted]w[redacted]d by ultrasound admitted for rupture of membranes, also noted to have preeclampsia recently, now reaching severe criteria based on BP levels. Patient is prior cesarean at 10 cm in [place] for breech(late recognition of breech), and has desired  TOLAC. Review of records does not reveal a signed TOLAC form, and Pt does not recall signing one.   I have given the patient a lengthy discussion of tolac, risks in great detail, explaining uterine rupture rates and significance, benefits, approximately 4 pm and allowed pt to review tolac form and now sign it .  All questions answered and patient confirms her strong desire to proceed with tolac effort. The patient received an early epidural, and is completely comfortable at the time of these discussions, with no distraction by labor process, no sedation.   Subjective: Comfortable , laboring slowly   Objective: BP 141/81  Pulse 91  Temp(Src) 98 F (36.7 C) (Axillary)  Resp 18  Ht 4\' 11"  (1.499 m)  Wt 83.462 kg (184 lb)  BMI 37.14 kg/m2  SpO2 99%  LMP 06/09/2012   Total I/O In: 1756.3 [P.O.:470; I.V.:1286.3] Out: 385 [Urine:385] Foley in place FHT:  FHR: 140's bpm, variability: moderate,  accelerations:  Present,  decelerations:  Absent UC:   irregular, every 5-7 minutesv SVE:   Dilation: 5.5 Effacement (%): 80 Station: -1 Exam by:: dr Emelda Fear IUPC inserted on pt rt side,9 oclock postion. Prior review of prior u/s  Placenta location anterior. Labs: Lab Results  Component Value Date   WBC 13.3* 02/15/2013   HGB 11.1* 02/15/2013   HCT 33.0* 02/15/2013   MCV 84.0 02/15/2013   PLT 214 02/15/2013  Mag level 6.0   Assessment / Plan: Augmentation of labor, progressing well Prior cesarean requesting TOLAC, progressing favorably, now with IUPC in place  Labor: Progressing normally Preeclampsia:  on magnesium sulfate, reduced to 1 gm/hr due to elevation of creatinine  Fetal Wellbeing:  Category I Pain  Control:  Epidural I/D:  . Anticipated MOD:  NSVD  Marisol Glazer V 02/15/2013, 6:30 PM

## 2013-02-15 NOTE — Anesthesia Procedure Notes (Signed)
Epidural Patient location during procedure: OB Start time: 02/15/2013 4:10 PM End time: 02/15/2013 4:14 PM  Staffing Anesthesiologist: Sandrea Hughs Performed by: anesthesiologist   Preanesthetic Checklist Completed: patient identified, site marked, surgical consent, pre-op evaluation, timeout performed, IV checked, risks and benefits discussed and monitors and equipment checked  Epidural Patient position: sitting Prep: site prepped and draped and DuraPrep Patient monitoring: continuous pulse ox and blood pressure Approach: midline Injection technique: LOR air  Needle:  Needle type: Tuohy  Needle gauge: 17 G Needle length: 9 cm and 9 Needle insertion depth: 6 cm Catheter type: closed end flexible Catheter size: 19 Gauge Catheter at skin depth: 11 cm Test dose: negative and Other  Assessment Sensory level: T8 Events: blood not aspirated, injection not painful, no injection resistance, negative IV test and no paresthesia  Additional Notes The 4x4 is soaked but no blood in catheter.Reason for block:procedure for pain

## 2013-02-15 NOTE — Progress Notes (Signed)
Holly Terrell is a 32 y.o. G2P1001 at [redacted]w[redacted]d  admitted for rupture of membranes, preeclampsia  Subjective:  Pt feeling contractions more, some rectal pressure Objective: Urine output has been minimal despite 2 fluid boluses of 500 cc LR.  10 pm labs notable for stable hgb,  And Mag level 6.1, minimal change from earlier. Plts stable,   Creatinine has increased to 1.21 consistent with  Fluid deficit, and third spacing. Will give additional fluid bolus, and increase to 200/hr.   BP 127/83  Pulse 88  Temp(Src) 98.4 F (36.9 C) (Axillary)  Resp 18  Ht 4\' 11"  (1.499 m)  Wt 83.462 kg (184 lb)  BMI 37.14 kg/m2  SpO2 97%  LMP 06/09/2012 I/O last 3 completed shifts: In: 2057.8 [P.O.:710; I.V.:1347.8] Out: 385 [Urine:385] Total I/O In: 1039.8 [P.O.:50; I.V.:489.8; IV Piggyback:500] Out: 70 [Urine:70]  FHT:  FHR: 140's with accels with scalp stim of exam bpm, variability: minimal ,  accelerations:  Present,  decelerations:  Absent UC:   regular, every 3-4 minutes mild intensity SVE:   Dilation: Lip/rim (anterior lip) Effacement (%): 90 Station: +1 Exam by:: Dr Cordie Grice  Labs: Lab Results  Component Value Date   WBC 15.1* 02/15/2013   HGB 10.8* 02/15/2013   HCT 32.9* 02/15/2013   MCV 83.9 02/15/2013   PLT 219 02/15/2013    Assessment / Plan: Augmentation of labor, progressing well  Labor: progressing slowly, will begin to push by MIdnight.  Preeclampsia:  on magnesium sulfate and fluid deficit due to third spacing Fetal Wellbeing:  Category I Pain Control:  Epidural I/D:  n/a Anticipated MOD:  undetermined.  Holly Terrell V 02/15/2013, 11:18 PM

## 2013-02-15 NOTE — Progress Notes (Signed)
Holly Terrell is a 32 y.o. G2P1001 at [redacted]w[redacted]d byadmitted for PROM, preeclampsia  Subjective:   Objective:  Urine output has dimished to 0cc, 30 cc over last 2 hours. A review of fluids shows that pt has been on fluid restriction, at less that 100 cc/hr plus p.o., and urine is concentrated   Will bolus x 500 cc LR, increase LR to 150/hr, and stop mag sulfate  BP 150/88  Pulse 101  Temp(Src) 98.4 F (36.9 C) (Axillary)  Resp 18  Ht 4\' 11"  (1.499 m)  Wt 83.462 kg (184 lb)  BMI 37.14 kg/m2  SpO2 99%  LMP 06/09/2012 I/O last 3 completed shifts: In: 2057.8 [P.O.:710; I.Terrell.:1347.8] Out: 385 [Urine:385] Total I/O In: 326.4 [I.Terrell.:326.4] Out: 30 [Urine:30]  FHT:  FHR: 140's accels with fetal scalp stimulation bpm, variability: moderate,  accelerations:  Present,  decelerations:  Absent UC:   regular, every 3.5 minutes of poor quality, lasting over 60 sec, but poor intensit SVE:   Dilation: 7 Effacement (%): 90 Station: -1 Exam by:: Dr Cordie Grice  Labs: Lab Results  Component Value Date   WBC 13.3* 02/15/2013   HGB 11.1* 02/15/2013   HCT 33.0* 02/15/2013   MCV 84.0 02/15/2013   PLT 214 02/15/2013    Assessment / Plan: Induction of labor due to preeclampsia and PROM,  progressing well on pitocin Mild dehydration and fluid deficit , needs increased iv fluids, and discontinuation of Mag Sulfate due to suspected hypermagnesemia. Prior Cesarean attempting TOLAC,   Labor: suboptimal progress, slowed by ineffectual labor Preeclampsia:  mag sulfate discontinued, previously mag 6.0 Fetal Wellbeing:  Category I Pain Control:  Epidural I/D:  n/a Anticipated MOD:  undetermined. currently labor inadequate.  Holly Terrell 02/15/2013, 8:27 PM

## 2013-02-15 NOTE — H&P (Signed)
Holly Terrell is a 32 y.o. female presenting for rupture of membranes.  Pt also has severe preeclampsia based on blood pressures of today.   Maternal Medical History:  Reason for admission: Rupture of membranes.   Contractions: Onset was 1-2 hours ago.   Frequency: irregular.    Fetal activity: Perceived fetal activity is normal.   Last perceived fetal movement was within the past hour.    Prenatal complications: Pre-eclampsia and preterm labor.     OB History   Grav Para Term Preterm Abortions TAB SAB Ect Mult Living   2 1 1  0 0 0 0 0 0 1     Past Medical History  Diagnosis Date  . No pertinent past medical history   . Urinary tract infection    Past Surgical History  Procedure Laterality Date  . Cesarean section    . Tonsillectomy     Family History: family history includes Birth defects in her son and Hypertension in her mother. Social History:  reports that she has never smoked. She has never used smokeless tobacco. She reports that she does not drink alcohol or use illicit drugs.   Prenatal Transfer Tool  Maternal Diabetes: No Genetic Screening: Declined Maternal Ultrasounds/Referrals: Abnormal:  Findings:   Other: Fetal Ultrasounds or other Referrals:  Referred to Materal Fetal Medicine  Maternal Substance Abuse:  No Significant Maternal Medications:  None Significant Maternal Lab Results:  Lab values include: Group B Strep negative Other Comments:  Pt with severe preeclampsia; fetal ultrasound with bilat ventriculomegaly (NIPT low risk for aneuploidy)  Review of Systems  Eyes: Negative.   Gastrointestinal: Positive for abdominal pain (mild contractions).  Neurological: Negative for headaches.  All other systems reviewed and are negative.       Filed Vitals:   02/15/13 0722  BP: 152/99  Pulse: 68  TempSrc: Oral  Resp: 18  Height: 4\' 11"  (1.499 m)  Weight: 83.462 kg (184 lb)    Blood pressure 152/99, pulse 68, resp. rate 18, height 4\' 11"  (1.499 m),  weight 83.462 kg (184 lb), last menstrual period 06/09/2012. Maternal Exam:  Uterine Assessment: Contraction strength is mild.  Contraction frequency is irregular.   Abdomen: Surgical scars: low transverse.   Estimated fetal weight is 8-8.5.    Introitus: Vagina is positive for vaginal discharge (mucusy).    Fetal Exam Fetal Monitor Review: Baseline rate: 120's.  Variability: moderate (6-25 bpm).   Pattern: accelerations present.    Fetal State Assessment: Category I - tracings are normal.     Physical Exam  Constitutional: She is oriented to person, place, and time. She appears well-developed and well-nourished. No distress.  HENT:  Head: Normocephalic.  Eyes: Pupils are equal, round, and reactive to light.  Neck: Normal range of motion. Neck supple.  Cardiovascular: Normal rate, regular rhythm and normal heart sounds.   Murmur:   Respiratory: Effort normal and breath sounds normal.  GI: Soft. There is no tenderness.  Genitourinary: No bleeding around the vagina. Vaginal discharge (mucusy) found.  Obvious gross rupture of membranes (soaking pads)  Musculoskeletal: Normal range of motion. She exhibits edema ( 3+).  Trace pedal edema  Neurological: She is alert and oriented to person, place, and time. She has normal reflexes. She displays normal reflexes.  Skin: Skin is warm and dry.    Prenatal labs: ABO, Rh: --/--/A POS (04/30 1655) Antibody: NEG (04/30 1655) Rubella: 1.84 (02/05 1206) RPR: NON REACTIVE (04/22 1620)  HBsAg: NEGATIVE (02/05 1206)  HIV: NON REACTIVE (03/19  1203)  GBS: Negative (04/22 0000)   Assessment/Plan: Severe Preeclampsia PPROM GBS negative Fetal Bilateral Ventriculomegaly  Plan: Admit to Birthing Suites Obtain preeclampsia labs Begin Magnesium Sulfate Pitocin Augmentation  [facility] 02/15/2013, 7:47 AM   Addendum Information:  Hx of c/s for breech presentation, desires TOLAC LGA (7#1oz at 33 weeks)  Continue with plan as  outlined above.  Levert Feinstein, MD Family Medicine PGY-1

## 2013-02-15 NOTE — Progress Notes (Signed)
Holly Terrell is a 32 y.o. G2P1001 at [redacted]w[redacted]d  Subjective:  Comfortable with epidural  Objective: BP 139/77  Pulse 87  Temp(Src) 98 F (36.7 C) (Axillary)  Resp 16  Ht 4\' 11"  (1.499 m)  Wt 83.462 kg (184 lb)  BMI 37.14 kg/m2  SpO2 99%  LMP 06/09/2012   Total I/O In: 1473.3 [P.O.:350; I.V.:1123.3] Out: 350 [Urine:350]  FHT:  FHR: 140 bpm, variability: moderate,  accelerations:  Present,  decelerations:  Absent UC:   regular, every 2-3 minutes SVE:   Dilation: 5 Effacement (%): 100 Station: 0 Exam by:: heather hogan cnm  Labs: Lab Results  Component Value Date   WBC 13.3* 02/15/2013   HGB 11.1* 02/15/2013   HCT 33.0* 02/15/2013   MCV 84.0 02/15/2013   PLT 214 02/15/2013    Assessment / Plan: Induction of labor due to preeclampsia,  progressing well on pitocin  Labor: Progressing normally Preeclampsia:  on magnesium sulfate Fetal Wellbeing:  Category I Pain Control:  Epidural I/D:  n/a Anticipated MOD:  NSVD  Tawnya Crook 02/15/2013, 4:47 PM

## 2013-02-15 NOTE — MAU Note (Signed)
Pt states that she was admitted to [facility] last week and discharged on Friday May the 2nd to rule out pre-eclampsia. PT has 2+ pitting edema noted to lower extremities. Denies any headache or visual changes. States she wants to try to delivery vaginally. She had a cesarean section in 2008 for breech presentation.

## 2013-02-15 NOTE — MAU Note (Signed)
Pt presents with complaints of SROM at 640am with clear fluid, states irregular contractions

## 2013-02-15 NOTE — Anesthesia Preprocedure Evaluation (Signed)
Anesthesia Evaluation  Patient identified by MRN, date of birth, ID band Patient awake    Reviewed: Allergy & Precautions, H&P , NPO status , Patient's Chart, lab work & pertinent test results  Airway Mallampati: III TM Distance: >3 FB Neck ROM: full    Dental no notable dental hx.    Pulmonary neg pulmonary ROS,    Pulmonary exam normal       Cardiovascular hypertension, Pt. on medications     Neuro/Psych negative neurological ROS  negative psych ROS   GI/Hepatic negative GI ROS, Neg liver ROS,   Endo/Other  Morbid obesity  Renal/GU negative Renal ROS  negative genitourinary   Musculoskeletal negative musculoskeletal ROS (+)   Abdominal Normal abdominal exam  (+) + obese,   Peds negative pediatric ROS (+)  Hematology negative hematology ROS (+)   Anesthesia Other Findings   Reproductive/Obstetrics (+) Pregnancy                           Anesthesia Physical Anesthesia Plan  ASA: III  Anesthesia Plan: Epidural   Post-op Pain Management:    Induction:   Airway Management Planned:   Additional Equipment:   Intra-op Plan:   Post-operative Plan:   Informed Consent: I have reviewed the patients History and Physical, chart, labs and discussed the procedure including the risks, benefits and alternatives for the proposed anesthesia with the patient or authorized representative who has indicated his/her understanding and acceptance.     Plan Discussed with:   Anesthesia Plan Comments:         Anesthesia Quick Evaluation

## 2013-02-15 NOTE — Progress Notes (Signed)
Called dr Arby Barrette after inserting f/c - aware that urine returned was 25cc of cyu - new order to return to pre epidural fluids - heather hogan cnm also aware of prior

## 2013-02-16 ENCOUNTER — Encounter (HOSPITAL_COMMUNITY): Payer: Self-pay | Admitting: Obstetrics

## 2013-02-16 ENCOUNTER — Inpatient Hospital Stay (HOSPITAL_COMMUNITY): Payer: Medicaid Other | Admitting: Anesthesiology

## 2013-02-16 ENCOUNTER — Other Ambulatory Visit: Payer: Medicaid Other

## 2013-02-16 ENCOUNTER — Encounter (HOSPITAL_COMMUNITY): Payer: Self-pay | Admitting: Anesthesiology

## 2013-02-16 ENCOUNTER — Encounter (HOSPITAL_COMMUNITY)
Admission: AD | Disposition: A | Discharge status: Home / Self Care | Payer: Self-pay | Source: Ambulatory Visit | Attending: Obstetrics and Gynecology

## 2013-02-16 DIAGNOSIS — O339 Maternal care for disproportion, unspecified: Secondary | ICD-10-CM | POA: Diagnosis not present

## 2013-02-16 DIAGNOSIS — O4290 Premature rupture of membranes, unspecified as to length of time between rupture and onset of labor, unspecified weeks of gestation: Secondary | ICD-10-CM

## 2013-02-16 DIAGNOSIS — O1414 Severe pre-eclampsia complicating childbirth: Secondary | ICD-10-CM

## 2013-02-16 LAB — URINE CULTURE: Culture: NO GROWTH

## 2013-02-16 LAB — CBC
HCT: 21 % — ABNORMAL LOW (ref 36.0–46.0)
Hemoglobin: 8.2 g/dL — ABNORMAL LOW (ref 12.0–15.0)
Hemoglobin: 7 g/dL — ABNORMAL LOW (ref 12.0–15.0)
Hemoglobin: 7.1 g/dL — ABNORMAL LOW (ref 12.0–15.0)
MCH: 29 pg (ref 26.0–34.0)
MCHC: 33.6 g/dL (ref 30.0–36.0)
MCV: 85.3 fL (ref 78.0–100.0)
MCV: 85 fL (ref 78.0–100.0)
Platelets: 209 10*3/uL (ref 150–400)
Platelets: 168 10*3/uL (ref 150–400)
RBC: 2.93 MIL/uL — ABNORMAL LOW (ref 3.87–5.11)
RDW: 15.5 % (ref 11.5–15.5)
WBC: 20.3 10*3/uL — ABNORMAL HIGH (ref 4.0–10.5)
WBC: 19.4 10*3/uL — ABNORMAL HIGH (ref 4.0–10.5)

## 2013-02-16 LAB — BASIC METABOLIC PANEL
Calcium: 7.4 mg/dL — ABNORMAL LOW (ref 8.4–10.5)
GFR calc Af Amer: 42 mL/min — ABNORMAL LOW (ref >90)
GFR calc non Af Amer: 36 mL/min — ABNORMAL LOW (ref >90)
Glucose, Bld: 103 mg/dL — ABNORMAL HIGH (ref 70–99)
Sodium: 131 mEq/L — ABNORMAL LOW (ref 135–145)

## 2013-02-16 LAB — COMPREHENSIVE METABOLIC PANEL
ALT: 9 U/L (ref 0–35)
Albumin: 1.3 g/dL — ABNORMAL LOW (ref 3.5–5.2)
Alkaline Phosphatase: 98 U/L (ref 39–117)
Chloride: 102 mEq/L (ref 96–112)
Glucose, Bld: 120 mg/dL — ABNORMAL HIGH (ref 70–99)
Potassium: 5.1 mEq/L (ref 3.5–5.1)
Sodium: 132 mEq/L — ABNORMAL LOW (ref 135–145)
Total Bilirubin: 0.2 mg/dL — ABNORMAL LOW (ref 0.3–1.2)
Total Protein: 3.8 g/dL — ABNORMAL LOW (ref 6.0–8.3)

## 2013-02-16 SURGERY — Surgical Case
Anesthesia: Epidural | Site: Abdomen | Wound class: Clean Contaminated

## 2013-02-16 MED ORDER — CEFAZOLIN SODIUM-DEXTROSE 2-3 GM-% IV SOLR
INTRAVENOUS | Status: DC | PRN
  Administered 2013-02-16: 2 g via INTRAVENOUS

## 2013-02-16 MED ORDER — NALOXONE HCL 0.4 MG/ML IJ SOLN: 0.4000 mg | INTRAMUSCULAR | Status: DC | PRN

## 2013-02-16 MED ORDER — SCOPOLAMINE 1 MG/3DAYS TD PT72: 1.0000 | MEDICATED_PATCH | Freq: Once | TRANSDERMAL | Status: DC

## 2013-02-16 MED ORDER — MEPERIDINE HCL 25 MG/ML IJ SOLN: 6.2500 mg | INTRAMUSCULAR | Status: DC | PRN

## 2013-02-16 MED ORDER — LACTATED RINGERS IV SOLN
INTRAVENOUS | Status: DC
  Administered 2013-02-16 – 2013-02-17 (×2): via INTRAVENOUS

## 2013-02-16 MED ORDER — KETOROLAC TROMETHAMINE 30 MG/ML IJ SOLN: 30.0000 mg | Freq: Four times a day (QID) | INTRAMUSCULAR | Status: AC | PRN

## 2013-02-16 MED ORDER — KETOROLAC TROMETHAMINE 60 MG/2ML IM SOLN: 60.0000 mg | Freq: Once | INTRAMUSCULAR | Status: AC | PRN

## 2013-02-16 MED ORDER — WITCH HAZEL-GLYCERIN EX PADS: 1.0000 "application " | MEDICATED_PAD | CUTANEOUS | Status: DC | PRN

## 2013-02-16 MED ORDER — CEFAZOLIN SODIUM 1-5 GM-% IV SOLN
1.0000 g | Freq: Three times a day (TID) | INTRAVENOUS | Status: AC
  Administered 2013-02-16 (×3): 1 g via INTRAVENOUS
  Filled 2013-02-16 (×4): qty 50

## 2013-02-16 MED ORDER — MORPHINE SULFATE (PF) 0.5 MG/ML IJ SOLN
INTRAMUSCULAR | Status: DC | PRN
  Administered 2013-02-16 (×2): .5 mg via EPIDURAL

## 2013-02-16 MED ORDER — OXYTOCIN 10 UNIT/ML IJ SOLN
INTRAMUSCULAR | Status: AC
  Filled 2013-02-16: qty 4

## 2013-02-16 MED ORDER — PHENYLEPHRINE 40 MCG/ML (10ML) SYRINGE FOR IV PUSH (FOR BLOOD PRESSURE SUPPORT)
PREFILLED_SYRINGE | INTRAVENOUS | Status: AC
  Filled 2013-02-16: qty 20

## 2013-02-16 MED ORDER — METOCLOPRAMIDE HCL 5 MG/ML IJ SOLN: 10.0000 mg | Freq: Three times a day (TID) | INTRAMUSCULAR | Status: DC | PRN

## 2013-02-16 MED ORDER — FENTANYL CITRATE 0.05 MG/ML IJ SOLN
INTRAMUSCULAR | Status: DC | PRN
  Administered 2013-02-16 (×2): 25 ug via INTRAVENOUS

## 2013-02-16 MED ORDER — DIPHENHYDRAMINE HCL 25 MG PO CAPS
25.0000 mg | ORAL_CAPSULE | Freq: Once | ORAL | Status: AC
  Administered 2013-02-16: 25 mg via ORAL

## 2013-02-16 MED ORDER — NALOXONE HCL 1 MG/ML IJ SOLN
1.0000 ug/kg/h | INTRAVENOUS | Status: DC | PRN
  Filled 2013-02-16: qty 2

## 2013-02-16 MED ORDER — NALBUPHINE HCL 10 MG/ML IJ SOLN
5.0000 mg | INTRAMUSCULAR | Status: DC | PRN
  Filled 2013-02-16: qty 1

## 2013-02-16 MED ORDER — SIMETHICONE 80 MG PO CHEW
80.0000 mg | CHEWABLE_TABLET | ORAL | Status: DC | PRN
  Administered 2013-02-17: 80 mg via ORAL

## 2013-02-16 MED ORDER — MENTHOL 3 MG MT LOZG
1.0000 | LOZENGE | OROMUCOSAL | Status: DC | PRN
  Filled 2013-02-16: qty 9

## 2013-02-16 MED ORDER — ONDANSETRON HCL 4 MG/2ML IJ SOLN: 4.0000 mg | INTRAMUSCULAR | Status: DC | PRN

## 2013-02-16 MED ORDER — SODIUM CHLORIDE 0.9 % IJ SOLN: 3.0000 mL | INTRAMUSCULAR | Status: DC | PRN

## 2013-02-16 MED ORDER — FUROSEMIDE 10 MG/ML IJ SOLN
20.0000 mg | Freq: Once | INTRAMUSCULAR | Status: AC
  Administered 2013-02-16: 20 mg via INTRAVENOUS
  Filled 2013-02-16: qty 2

## 2013-02-16 MED ORDER — MORPHINE SULFATE (PF) 0.5 MG/ML IJ SOLN
INTRAMUSCULAR | Status: DC | PRN
  Administered 2013-02-16: 4 mg via EPIDURAL

## 2013-02-16 MED ORDER — DIPHENHYDRAMINE HCL 50 MG/ML IJ SOLN: 25.0000 mg | INTRAMUSCULAR | Status: DC | PRN

## 2013-02-16 MED ORDER — TETANUS-DIPHTH-ACELL PERTUSSIS 5-2.5-18.5 LF-MCG/0.5 IM SUSP
0.5000 mL | Freq: Once | INTRAMUSCULAR | Status: DC
  Filled 2013-02-16: qty 0.5

## 2013-02-16 MED ORDER — ZOLPIDEM TARTRATE 5 MG PO TABS: 5.0000 mg | ORAL_TABLET | Freq: Every evening | ORAL | Status: DC | PRN

## 2013-02-16 MED ORDER — OXYCODONE-ACETAMINOPHEN 5-325 MG PO TABS
1.0000 | ORAL_TABLET | ORAL | Status: DC | PRN
  Administered 2013-02-17 – 2013-02-18 (×3): 1 via ORAL
  Administered 2013-02-19 (×2): 2 via ORAL
  Filled 2013-02-16 (×3): qty 1
  Filled 2013-02-16 (×2): qty 2

## 2013-02-16 MED ORDER — LACTATED RINGERS IV SOLN
INTRAVENOUS | Status: DC | PRN
  Administered 2013-02-16: 02:00:00 via INTRAVENOUS

## 2013-02-16 MED ORDER — ONDANSETRON HCL 4 MG/2ML IJ SOLN
INTRAMUSCULAR | Status: AC
  Filled 2013-02-16: qty 2

## 2013-02-16 MED ORDER — LIDOCAINE-EPINEPHRINE (PF) 2 %-1:200000 IJ SOLN
INTRAMUSCULAR | Status: DC | PRN
  Administered 2013-02-16: 5 mL via EPIDURAL

## 2013-02-16 MED ORDER — SIMETHICONE 80 MG PO CHEW
80.0000 mg | CHEWABLE_TABLET | Freq: Three times a day (TID) | ORAL | Status: DC
  Administered 2013-02-16 – 2013-02-19 (×10): 80 mg via ORAL

## 2013-02-16 MED ORDER — FENTANYL CITRATE 0.05 MG/ML IJ SOLN
INTRAMUSCULAR | Status: AC
  Filled 2013-02-16: qty 2

## 2013-02-16 MED ORDER — ACETAMINOPHEN 10 MG/ML IV SOLN
1000.0000 mg | Freq: Four times a day (QID) | INTRAVENOUS | Status: AC | PRN
  Filled 2013-02-16: qty 100

## 2013-02-16 MED ORDER — MORPHINE SULFATE 0.5 MG/ML IJ SOLN
INTRAMUSCULAR | Status: AC
  Filled 2013-02-16: qty 10

## 2013-02-16 MED ORDER — EPHEDRINE SULFATE 50 MG/ML IJ SOLN
INTRAMUSCULAR | Status: DC | PRN
  Administered 2013-02-16: 10 mg via INTRAVENOUS

## 2013-02-16 MED ORDER — DIBUCAINE 1 % RE OINT
1.0000 "application " | TOPICAL_OINTMENT | RECTAL | Status: DC | PRN
  Filled 2013-02-16: qty 28

## 2013-02-16 MED ORDER — OXYTOCIN 40 UNITS IN LACTATED RINGERS INFUSION - SIMPLE MED: 62.5000 mL/h | INTRAVENOUS | Status: AC

## 2013-02-16 MED ORDER — NALBUPHINE SYRINGE 5 MG/0.5 ML
5.0000 mg | INJECTION | INTRAMUSCULAR | Status: DC | PRN
  Filled 2013-02-16: qty 1

## 2013-02-16 MED ORDER — ONDANSETRON HCL 4 MG PO TABS: 4.0000 mg | ORAL_TABLET | ORAL | Status: DC | PRN

## 2013-02-16 MED ORDER — DIPHENHYDRAMINE HCL 50 MG/ML IJ SOLN: 12.5000 mg | INTRAMUSCULAR | Status: DC | PRN

## 2013-02-16 MED ORDER — SCOPOLAMINE 1 MG/3DAYS TD PT72
MEDICATED_PATCH | TRANSDERMAL | Status: AC
  Administered 2013-02-16: 1.5 mg via TRANSDERMAL
  Filled 2013-02-16: qty 1

## 2013-02-16 MED ORDER — SENNOSIDES-DOCUSATE SODIUM 8.6-50 MG PO TABS
2.0000 | ORAL_TABLET | Freq: Every day | ORAL | Status: DC
  Administered 2013-02-17: 2 via ORAL

## 2013-02-16 MED ORDER — PHENYLEPHRINE HCL 10 MG/ML IJ SOLN
INTRAMUSCULAR | Status: DC | PRN
  Administered 2013-02-16 (×3): 80 ug via INTRAVENOUS

## 2013-02-16 MED ORDER — ONDANSETRON HCL 4 MG/2ML IJ SOLN: 4.0000 mg | Freq: Three times a day (TID) | INTRAMUSCULAR | Status: DC | PRN

## 2013-02-16 MED ORDER — KETOROLAC TROMETHAMINE 60 MG/2ML IM SOLN
INTRAMUSCULAR | Status: AC
  Administered 2013-02-16: 60 mg via INTRAMUSCULAR
  Filled 2013-02-16: qty 2

## 2013-02-16 MED ORDER — HYDROMORPHONE HCL PF 1 MG/ML IJ SOLN: 0.2500 mg | INTRAMUSCULAR | Status: DC | PRN

## 2013-02-16 MED ORDER — MAGNESIUM SULFATE 40 G IN LACTATED RINGERS - SIMPLE
1.0000 g/h | INTRAVENOUS | Status: DC
  Administered 2013-02-16: 1 g/h via INTRAVENOUS
  Administered 2013-02-16: 0.5 g/h via INTRAVENOUS
  Filled 2013-02-16: qty 500

## 2013-02-16 MED ORDER — DIPHENHYDRAMINE HCL 25 MG PO CAPS: 25.0000 mg | ORAL_CAPSULE | Freq: Four times a day (QID) | ORAL | Status: DC | PRN

## 2013-02-16 MED ORDER — 0.9 % SODIUM CHLORIDE (POUR BTL) OPTIME
TOPICAL | Status: DC | PRN
  Administered 2013-02-16: 300 mL
  Administered 2013-02-16: 200 mL

## 2013-02-16 MED ORDER — LANOLIN HYDROUS EX OINT: 1.0000 "application " | TOPICAL_OINTMENT | CUTANEOUS | Status: DC | PRN

## 2013-02-16 MED ORDER — IBUPROFEN 600 MG PO TABS
600.0000 mg | ORAL_TABLET | Freq: Four times a day (QID) | ORAL | Status: DC
  Administered 2013-02-16 – 2013-02-19 (×10): 600 mg via ORAL
  Filled 2013-02-16 (×10): qty 1

## 2013-02-16 MED ORDER — ACETAMINOPHEN 325 MG PO TABS
650.0000 mg | ORAL_TABLET | Freq: Once | ORAL | Status: AC
  Administered 2013-02-16: 650 mg via ORAL
  Filled 2013-02-16: qty 2

## 2013-02-16 MED ORDER — PRENATAL MULTIVITAMIN CH
1.0000 | ORAL_TABLET | Freq: Every day | ORAL | Status: DC
  Administered 2013-02-16 – 2013-02-18 (×3): 1 via ORAL
  Filled 2013-02-16 (×3): qty 1

## 2013-02-16 MED ORDER — DIPHENHYDRAMINE HCL 25 MG PO CAPS
25.0000 mg | ORAL_CAPSULE | ORAL | Status: DC | PRN
  Filled 2013-02-16: qty 1

## 2013-02-16 MED ORDER — LACTATED RINGERS IV SOLN
INTRAVENOUS | Status: DC
  Administered 2013-02-16: 62.5 mL/h via INTRAVENOUS

## 2013-02-16 SURGICAL SUPPLY — 37 items
BENZOIN TINCTURE PRP APPL 2/3 (GAUZE/BANDAGES/DRESSINGS) IMPLANT
CLOTH BEACON ORANGE TIMEOUT ST (SAFETY) ×2 IMPLANT
DRAPE LG THREE QUARTER DISP (DRAPES) ×2 IMPLANT
DRSG OPSITE POSTOP 4X10 (GAUZE/BANDAGES/DRESSINGS) ×2 IMPLANT
DURAPREP 26ML APPLICATOR (WOUND CARE) ×2 IMPLANT
ELECT REM PT RETURN 9FT ADLT (ELECTROSURGICAL) ×2
ELECTRODE REM PT RTRN 9FT ADLT (ELECTROSURGICAL) ×1 IMPLANT
EXTRACTOR VACUUM KIWI (MISCELLANEOUS) IMPLANT
GLOVE BIO SURGEON ST LM GN SZ9 (GLOVE) ×2 IMPLANT
GLOVE BIOGEL PI IND STRL 9 (GLOVE) ×1 IMPLANT
GLOVE BIOGEL PI INDICATOR 9 (GLOVE) ×1
GOWN PREVENTION PLUS XLARGE (GOWN DISPOSABLE) ×2 IMPLANT
GOWN STRL REIN 3XL LVL4 (GOWN DISPOSABLE) ×2 IMPLANT
GOWN STRL REIN XL XLG (GOWN DISPOSABLE) ×4 IMPLANT
NEEDLE HYPO 25X5/8 SAFETYGLIDE (NEEDLE) ×2 IMPLANT
NS IRRIG 1000ML POUR BTL (IV SOLUTION) ×2 IMPLANT
PACK C SECTION WH (CUSTOM PROCEDURE TRAY) ×2 IMPLANT
PAD ABD 7.5X8 STRL (GAUZE/BANDAGES/DRESSINGS) ×2 IMPLANT
PAD OB MATERNITY 4.3X12.25 (PERSONAL CARE ITEMS) ×2 IMPLANT
RETRACTOR WND ALEXIS 25 LRG (MISCELLANEOUS) IMPLANT
RTRCTR C-SECT PINK 25CM LRG (MISCELLANEOUS) ×2 IMPLANT
RTRCTR WOUND ALEXIS 25CM LRG (MISCELLANEOUS)
SPONGE LAP 18X18 X RAY DECT (DISPOSABLE) ×8 IMPLANT
STRIP CLOSURE SKIN 1/2X4 (GAUZE/BANDAGES/DRESSINGS) ×2 IMPLANT
SUT CHROMIC 0 CTX 36 (SUTURE) ×12 IMPLANT
SUT VIC AB 0 CT1 27 (SUTURE) ×2
SUT VIC AB 0 CT1 27XBRD ANBCTR (SUTURE) ×2 IMPLANT
SUT VIC AB 2-0 CT1 27 (SUTURE) ×2
SUT VIC AB 2-0 CT1 TAPERPNT 27 (SUTURE) ×2 IMPLANT
SUT VIC AB 3-0 SH 27 (SUTURE) ×2
SUT VIC AB 3-0 SH 27X BRD (SUTURE) ×2 IMPLANT
SUT VIC AB 4-0 KS 27 (SUTURE) ×2 IMPLANT
SYR BULB IRRIGATION 50ML (SYRINGE) IMPLANT
TAPE CLOTH SURG 4X10 WHT LF (GAUZE/BANDAGES/DRESSINGS) ×2 IMPLANT
TOWEL OR 17X24 6PK STRL BLUE (TOWEL DISPOSABLE) ×2 IMPLANT
TRAY FOLEY CATH 14FR (SET/KITS/TRAYS/PACK) IMPLANT
WATER STERILE IRR 1000ML POUR (IV SOLUTION) ×2 IMPLANT

## 2013-02-16 NOTE — Progress Notes (Signed)
I notified Dr. Arby Barrette of patient's pulse o2 dropping below 85 frequently. MD suggested a chest x-ray to rule out pulmonary edema, or a respiratory evaluation. He told RN to call OB for further orders.

## 2013-02-16 NOTE — OR Nursing (Signed)
75 ml blood loss during fundal massage by DLWegner RN, cord blood x 2 to OR front desk

## 2013-02-16 NOTE — Progress Notes (Signed)
Patient ID: Holly Terrell, female   DOB: Mar 11, 1981, 32 y.o.   MRN: [id number]   S:  Pt feels tired, has been feeling dizzy when getting out of bed. No headache or vision changes.  O:  Filed Vitals:   02/16/13 1215 02/16/13 1224 02/16/13 1300 02/16/13 1400  BP:  119/54 114/56 112/60  Pulse: 88 77 82 73  Temp:   98.4 F (36.9 C)   TempSrc:   Oral   Resp:  18 18   Height:   4\' 11"  (1.499 m)   Weight:   86.002 kg (189 lb 9.6 oz)   SpO2:    100%    Urine out put 6 am - 10 am:  100 ml 10 am - 12 pm:  50 ml 12 pm - 1 pm:  20 ml   A/P 32 y.o. [id number] s/p cesarean section with retained placenta/PPH and severe preeclampsia - Oliguria - despite lasix and magnesium.  Will increase mag to 1 g/hr and recheck level in 2 hours.  - Symptomatic anemia - will give 1 unit RBC then lasix and recheck CBC 2 hours post-transfusion - Acute renal insufficiency (creatinine 1.66 this AM). Recheck bmp at 6 PM. - BP is controlled.   Napoleon Form, MD 02/16/2013 3:11 PM

## 2013-02-16 NOTE — Progress Notes (Signed)
Patient ID: Holly Terrell, female   DOB: [birth year], 32 y.o.   MRN: [id number] Patient has been pushing for over an hour. Dr. Emelda Fear notified of patient status. No change in descent of fetal vertex. Will have Dr Emelda Fear evaluate for RLTCS.   Tawnya Crook

## 2013-02-16 NOTE — Brief Op Note (Signed)
02/15/2013 - 02/16/2013  3:51 AM  PATIENT:  Holly Terrell  32 y.o. female  PRE-OPERATIVE DIAGNOSIS:  cephalopelvic disproportion pregnancy 36 weeks Cephalopelvic disproportion  POST-OPERATIVE DIAGNOSIS:  Pregnancy 36 weeks, cephalopelvic disproportion, occiput posterior, incidental cystotomy  PROCEDURE:  Procedure(s): Repeat CESAREAN SECTION of baby boy at 0221 APGAR 7/9 (N/A) Repair of incidental cystotomy SURGEON:  Surgeon(s) and Role:    * Tilda Burrow, MD - Primary  PHYSICIAN ASSISTANT: none    ASSISTANTS: none   ANESTHESIA:   epidural  EBL:  Total I/O In: 4498.8 [P.O.:150; I.V.:3848.8; IV Piggyback:500] Out: 785 [Urine:85; Blood:700]  BLOOD ADMINISTERED:none  DRAINS: Urinary Catheter (Foley)   LOCAL MEDICATIONS USED:  Amount: 30 ml and OTHER  3% nesacaine intraabdominal  SPECIMEN:  Source of Specimen:  placenta to path.  DISPOSITION OF SPECIMEN:  PATHOLOGY  COUNTS:  YES  TOURNIQUET:  * No tourniquets in log *  DICTATION: .Dragon Dictation  PLAN OF CARE: Admit to inpatient   PATIENT DISPOSITION:  ICU - extubated and stable.   Delay start of Pharmacological VTE agent (>24hrs) due to surgical blood loss or risk of bleeding: not applicable

## 2013-02-16 NOTE — Transfer of Care (Signed)
Immediate Anesthesia Transfer of Care Note  Patient: Holly Terrell  Procedure(s) Performed: Procedure(s): Repeat CESAREAN SECTION of baby boy at 73 APGAR 7/9 (N/A)  Patient Location: PACU  Anesthesia Type:Epidural  Level of Consciousness: awake, alert  and oriented  Airway & Oxygen Therapy: Patient Spontanous Breathing  Post-op Assessment: Report given to PACU RN  Post vital signs: Reviewed and stable  Complications: No apparent anesthesia complications

## 2013-02-16 NOTE — Op Note (Signed)
Procedure: Repeat low transverse cervical cesarean section after failed trial of labor, repair of incidental cystotomy Surgeon Emelda Fear Details of procedure: Arrest of dilation was diagnosed and complete complete +1 station. Patient was taken operating room epidural topped up and analgesia confirmed. Ancef 2 g intravenously administered. Transverse lower abdominal incision was made at the panniculus crease, approximately 20 cm in length with sharp dissection through very edematous tissues to the fascia which was (vertically. Peritoneal cavity was entered being careful to stay high, generous clear peritoneal fluid encountered. Alexis wound retractor was placed in the abdominal cavity. Bladder flap was developed over the lower uterine segment, and mobilized inferiorly. Transverse uterine incision was made, and was found to be at the level of the fetal chin with vertex in an occiput posterior presentation. Elevation pressure on the shoulder was required to help dislodge the vertex from its very impacted position in the pelvis,, with fetal vertex rotated into the incision clockwise, and delivered by fundal pressure area infant cried promptly there was significant molding and caput present. Cord was clamped, and the pediatricians care for the baby Apgars 7 and 9 were assigned  Next placenta delivered with some difficulty , it did not wish to detach from the placental bed which was anterior and fundal ,eventually it was removed in its entirety with membranes intact. Inspection lower uterine segment showed an inferior tear laterally on the right side, with the circumferential extension of the transverse incision going past the uterine vessels on the left side. Laparotomy tapes were placed behind the uterus to protect the bowel, and the inferior tear on the patient's right side was carefully reapproximated, with periodic inspection of bowel and bladder to insure safety. Inspection of the bladder revealed a cystotomy  across the dome of the bladder , 3.5 cm in transverse length, located proximally where the uterine incision and bladder felt development would've been. There were at least 2.5 cm of clear distance from cystotomy to the cesarean section incision line. The uterus was able be closed, beginning in the side and in doing a running locking first layer of 0 chromic followed by a continuous running second layer 0 chromic. Uterine vessels were ligated on the patient's left side. Cystotomy repair: The cystotomy could be clearly identified. The internal contours the bladder could be inspected. The cystotomy was on the bladder dome, several centimeters from the trigone. The Foley catheter was intact. Primary closure was performed then, sewing in a continuous transverse running fashion with 3-0 Vicryl just beneath the bladder mucosa, and then oversewn with a second continuous running layer of 3-0 Vicryl. Sterile milk was instilled in a retrograde fashion, 150 cc and light pressure on the bladder resulted in no identifiable extravasation of sterile milk. Bladder was decompressed removing the Kelly clamp across the catheter bag  tubing. Abdomen was irrigated and, laparotomy counts were correct,, and anterior peritoneum closed using running 2-0 Vicryl followed by a continuous running 0 Vicryl closure the fascial layer, irrigation of subcutaneous fatty tissues which were still quite edematous, and then subcutaneous closure of the skin edges with good hemostasis. Estimated blood loss 700 cc. Sponge and needle counts correct.

## 2013-02-16 NOTE — Anesthesia Postprocedure Evaluation (Signed)
  Anesthesia Post-op Note  Patient: Holly Terrell  Procedure(s) Performed: Procedure(s): Repeat CESAREAN SECTION of baby boy at 89 APGAR 7/9 (N/A)  Patient Location: Mother/Baby  Anesthesia Type:Epidural  Level of Consciousness: awake, alert  and oriented  Airway and Oxygen Therapy: Patient Spontanous Breathing and Patient connected to nasal cannula oxygen  Post-op Pain: mild  Post-op Assessment: Post-op Vital signs reviewed, Patient's Cardiovascular Status Stable, No headache, No backache, No residual numbness and No residual motor weakness  Post-op Vital Signs: Reviewed and stable  Complications: No apparent anesthesia complications

## 2013-02-16 NOTE — Progress Notes (Signed)
Subjective: Postpartum Day 0: Cesarean Delivery Patient reports tolerating PO.  C/S at 2:20 AM. Just back to room. Foley still in. Has not been out of bed yet, taking fluids but not solids yet.  Objective: Vital signs in last 24 hours: Temp:  [97.8 F (36.6 C)-99.1 F (37.3 C)] 98.6 F (37 C) (05/05 0622) Pulse Rate:  [74-118] 89 (05/05 0803) Resp:  [14-22] 16 (05/05 0802) BP: (103-167)/(45-105) 112/45 mmHg (05/05 0802) SpO2:  [85 %-99 %] 94 % (05/05 0802)  Filed Vitals:   02/16/13 0753 02/16/13 0758 02/16/13 0802 02/16/13 0803  BP:   112/45   Pulse: 86 103 94 89  Temp:      TempSrc:      Resp:   16   Height:      Weight:      SpO2: 95% 98% 94%    I/O:    Intake/Output Summary (Last 24 hours) at 02/16/13 0807 Last data filed at 02/16/13 0700  Gross per 24 hour  Intake 7074.11 ml  Output   1295 ml  Net 5779.11 ml   Only 65 ml urine out since surgery.   Physical Exam:  General: alert, cooperative and no distress Lochia: appropriate Uterine Fundus: firm Incision: no significant drainage DVT Evaluation: No evidence of DVT seen on physical exam. 3+ pitting edema to thighs bilaterally.   Recent Labs  02/15/13 2158 02/16/13 0500  HGB 10.8* 8.2*  HCT 32.9* 25.0*    Assessment/Plan: Status post Cesarean section with cystotomy repair. Postoperative course complicated by oliguria, anemia  CMP and CBC due at 10 AM. Consider lasix if no urine output by mid-morning. Foley will stay in for 7 days. FeSO4  Napoleon Form 02/16/2013, 8:06 AM

## 2013-02-16 NOTE — Progress Notes (Signed)
Holly Terrell is a 32 y.o. G2P1001 at [redacted]w[redacted]d by  admitted for rupture of membranes, preeclampsia  Subjective: Pt has pushed very well for over an hour, and has had NO progress from c/c/+1 . Urine output less than 15 cc despite additional 500 cc fluid boluses x2  Over last 3 hours. Will proceed to cesarean delivery for CPD  Objective: BP 161/96  Pulse 103  Temp(Src) 98.6 F (37 C) (Oral)  Resp 20  Ht 4\' 11"  (1.499 m)  Wt 83.462 kg (184 lb)  BMI 37.14 kg/m2  SpO2 98%  LMP 06/09/2012 I/O last 3 completed shifts: In: 2057.8 [P.O.:710; I.V.:1347.8] Out: 385 [Urine:385] Total I/O In: 2498.8 [P.O.:150; I.V.:1848.8; IV Piggyback:500] Out: 85 [Urine:85]  FHT:  FHR: 140 bpm, variability: minimal ,  accelerations:  Present,  decelerations:  Absent UC:   regular, every 4 minutes SVE:   Dilation: 10 Effacement (%): 90 Station: +1 Exam by:: [employer] CNM  Labs: Lab Results  Component Value Date   WBC 15.1* 02/15/2013   HGB 10.8* 02/15/2013   HCT 32.9* 02/15/2013   MCV 83.9 02/15/2013   PLT 219 02/15/2013    Assessment / Plan: Arrest in active phase of labor  Labor: to OR for cesarean delivery Preeclampsia:  labs stable and Mag level 6.1 after mag discontinued Fetal Wellbeing:  Category I Pain Control:  Epidural I/D:  n/a Anticipated MOD:  cesarean section.  OR notified, and blood to be in room.  Kelyse Pask V 02/16/2013, 1:39 AM

## 2013-02-16 NOTE — Anesthesia Postprocedure Evaluation (Signed)
  Anesthesia Post-op Note  Patient: Holly Terrell  Procedure(s) Performed: Procedure(s): Repeat CESAREAN SECTION of baby boy at 75 APGAR 7/9 (N/A)   Patient is awake, responsive, moving her legs, and has signs of resolution of her numbness. Pain and nausea are reasonably well controlled. Vital signs are stable and clinically acceptable. Oxygen saturation is clinically acceptable. There are no apparent anesthetic complications at this time. Patient is ready for discharge.

## 2013-02-16 NOTE — Consult Note (Signed)
Neonatology Note:   Attendance at C-section:    I was asked by Dr. Emelda Fear to attend this repeat C/S at 36 0/[redacted] weeks GA following SROM this morning and onset of labor. The mother is a G2P1 A pos, GBS neg with PIH, on magnesium sulfate. ROM 20 hours prior to delivery, fluid clear, mother afebrile during labor.FHR was normal during labor. Infant vigorous with good spontaneous cry and tone. Needed only bulb suctioning. PE remarkable for marked head molding and some mild facial swelling due to in utero position (OP). Ap 7/9. Lungs clear to ausc in DR. Spoke with his mother and feel he is doing well enough for central nursery, but have instructed his nurse to call me for any problems. To CN to care of Pediatrician.   Doretha Sou, MD

## 2013-02-16 NOTE — Progress Notes (Signed)
I notified Dr. Thad Ranger of pulse 02 and told her Dr. Flonnie Overman suggestions. MD gave orders for 2L oxygen nasal canula and said she would be in to evaluate the patient later on today.

## 2013-02-16 NOTE — Anesthesia Postprocedure Evaluation (Signed)
Anesthesia Post Note  Patient: Holly Terrell  Procedure(s) Performed: Procedure(s) (LRB): Repeat CESAREAN SECTION of baby boy at 12 APGAR 7/9 (N/A)  Anesthesia type: Epidural  Patient location: Mother/Baby  Post pain: Pain level controlled  Post assessment: Post-op Vital signs reviewed  Last Vitals:  Filed Vitals:   02/16/13 1300  BP: 114/56  Pulse: 82  Temp:   Resp:     Post vital signs: Reviewed  Level of consciousness:alert  Complications: No apparent anesthesia complications

## 2013-02-17 ENCOUNTER — Encounter (HOSPITAL_COMMUNITY): Payer: Self-pay | Admitting: Obstetrics and Gynecology

## 2013-02-17 LAB — CBC
MCH: 29.1 pg (ref 26.0–34.0)
Platelets: 182 10*3/uL (ref 150–400)
RBC: 2.54 MIL/uL — ABNORMAL LOW (ref 3.87–5.11)

## 2013-02-17 LAB — PREPARE RBC (CROSSMATCH)

## 2013-02-17 LAB — BASIC METABOLIC PANEL
Calcium: 7.6 mg/dL — ABNORMAL LOW (ref 8.4–10.5)
GFR calc Af Amer: 61 mL/min — ABNORMAL LOW (ref >90)
GFR calc non Af Amer: 52 mL/min — ABNORMAL LOW (ref >90)
Glucose, Bld: 80 mg/dL (ref 70–99)
Potassium: 5 mEq/L (ref 3.5–5.1)
Sodium: 135 mEq/L (ref 135–145)

## 2013-02-17 LAB — COMPREHENSIVE METABOLIC PANEL
ALT: 5 U/L (ref 0–35)
Alkaline Phosphatase: 87 U/L (ref 39–117)
BUN: 18 mg/dL (ref 6–23)
CO2: 25 mEq/L (ref 19–32)
Chloride: 104 mEq/L (ref 96–112)
GFR calc Af Amer: 45 mL/min — ABNORMAL LOW (ref >90)
GFR calc non Af Amer: 39 mL/min — ABNORMAL LOW (ref >90)
Glucose, Bld: 88 mg/dL (ref 70–99)
Potassium: 5.1 mEq/L (ref 3.5–5.1)
Sodium: 134 mEq/L — ABNORMAL LOW (ref 135–145)
Total Bilirubin: 0.1 mg/dL — ABNORMAL LOW (ref 0.3–1.2)

## 2013-02-17 MED ORDER — FUROSEMIDE 10 MG/ML IJ SOLN
20.0000 mg | Freq: Once | INTRAMUSCULAR | Status: AC
  Administered 2013-02-17: 20 mg via INTRAVENOUS
  Filled 2013-02-17: qty 2

## 2013-02-17 NOTE — Progress Notes (Signed)
Subjective: Postpartum Day 1: Cesarean Delivery Patient reports incisional pain along with abd soreness. No nausea, no vomiting.   Objective: Vital signs in last 24 hours: Currently 110/55, pulse 80's Urine output has picked up significantly, over 2 l overnite, slightly pink(s/p cystotomy) Temp:  [97.7 F (36.5 C)-98.7 F (37.1 C)] 98.1 F (36.7 C) (05/06 0400) Pulse Rate:  [63-117] 76 (05/06 0600) Resp:  [12-18] 18 (05/06 0400) BP: (98-119)/(39-75) 110/55 mmHg (05/06 0600) SpO2:  [65 %-100 %] 94 % (05/06 0600) Weight:  [189 lb 9.6 oz (86.002 kg)-190 lb 4.8 oz (86.32 kg)] 190 lb 4.8 oz (86.32 kg) (05/06 0500)  Physical Exam:  General: alert, cooperative, fatigued, no distress and pale Lochia: appropriate Uterine Fundus: difficult to assess due to body habitus Incision: no significant erythema, one small area of ooze on left of dressing, stable, no bruising DVT Evaluation: No evidence of DVT seen on physical exam. Chest:  Clear to ausc, no rales or ronchi Abd: Bowel sounds active , normal pitch.   Recent Labs  02/16/13 2115 02/17/13 0505  HGB 7.1* 7.4*  HCT 21.1* 21.6*   CMP     Component Value Date/Time   NA 134* 02/17/2013 0505   K 5.1 02/17/2013 0505   CL 104 02/17/2013 0505   CO2 25 02/17/2013 0505   GLUCOSE 88 02/17/2013 0505   BUN 18 02/17/2013 0505   CREATININE 1.74* 02/17/2013 0505   CREATININE 1.04 02/11/2013 1630   CALCIUM 7.4* 02/17/2013 0505   PROT 3.8* 02/17/2013 0505   ALBUMIN 1.2* 02/17/2013 0505   AST 17 02/17/2013 0505   ALT 5 02/17/2013 0505   ALKPHOS 87 02/17/2013 0505   BILITOT 0.1* 02/17/2013 0505   GFRNONAA 39* 02/17/2013 0505   GFRAA 45* 02/17/2013 0505  Cr slight progress,  Albumin low,      Assessment/Plan: Status post Cesarean section complicated by inferior extension of uterine incision, incidental cystotomy Preeclampsia, s/p 24 hrs magnesium sulfate, now d/c'd  . Postoperative course complicated by preeclampsia, greater blood loss than initially assessed at  cesarean   Persistent anemia.. Plan: d/c magnesium,         Transfuse 2 additional units prbc         Lasix 20 between units        Recheck cbc, bmp 4 hrs post transfusion.  Holly Terrell V 02/17/2013, 6:26 AM

## 2013-02-17 NOTE — Progress Notes (Signed)
UR chart review completed.  

## 2013-02-18 LAB — TYPE AND SCREEN
ABO/RH(D): A POS
Unit division: 0
Unit division: 0

## 2013-02-18 LAB — CBC
Hemoglobin: 9.5 g/dL — ABNORMAL LOW (ref 12.0–15.0)
Platelets: 178 10*3/uL (ref 150–400)
RBC: 3.28 MIL/uL — ABNORMAL LOW (ref 3.87–5.11)
WBC: 13.8 10*3/uL — ABNORMAL HIGH (ref 4.0–10.5)

## 2013-02-18 MED ORDER — SODIUM CHLORIDE 0.9 % IJ SOLN
3.0000 mL | INTRAMUSCULAR | Status: DC | PRN
  Administered 2013-02-18: 3 mL via INTRAVENOUS

## 2013-02-18 NOTE — Progress Notes (Signed)
Mother changed to formula feeding. ?

## 2013-02-18 NOTE — Progress Notes (Signed)
Subjective: Postpartum Day 2: Cesarean Delivery Patient reports incisional pain.  Would like to go home today. Denies headache, vision changes, RUQ pain. Has not ambulated yet. Foley still in place.   Objective: Vital signs in last 24 hours: Temp:  [97.8 F (36.6 C)-98.7 F (37.1 C)] 98.7 F (37.1 C) (05/07 0643) Pulse Rate:  [65-86] 65 (05/07 0643) Resp:  [18-20] 20 (05/07 0643) BP: (113-148)/(50-90) 145/84 mmHg (05/07 0643) SpO2:  [95 %-99 %] 98 % (05/06 1651) Urine output last 24: 4.14 L (2 cc/kg/hr) BP's recently consistently in 140s systolic, normal diastolics  Physical Exam:  General: alert, cooperative and no distress Lochia: appropriate Uterine Fundus: difficult to assess due to body habitus and intolerance of exam due to incisional pain Incision: dressing is clean, dry, and intact DVT Evaluation: No evidence of DVT seen on physical exam. Ext: 2+ pitting edema bilaterally  CBC  Recent Labs Lab 02/16/13 2115 02/17/13 0505 02/17/13 1800  WBC 16.7* 13.7* 13.8*  HGB 7.1* 7.4* 9.5*  HCT 21.1* 21.6* 28.0*  PLT 168 182 178    BMET  Recent Labs Lab 02/16/13 2115 02/17/13 0505 02/17/13 1800  NA 131* 134* 135  K 4.9 5.1 5.0  CL 100 104 103  CO2 24 25 26   BUN 17 18 19   CREATININE 1.83* 1.74* 1.35*  GLUCOSE 103* 88 80  CALCIUM 7.4* 7.4* 7.6*    Assessment/Plan: Status post Cesarean section complicated by inferior extension of uterine incision, incidental cystotomy. Preeclampsia, s/p 24 hrs magnesium sulfate, now d/c'd and transferred out of ICU. Postoperative course complicated by preeclampsia, greater blood loss than initially assessed at cesarean. Persistent anemia, transfused 2 units PRBC yesterday with subsequent Hgb of 9.5. Will discuss at rounds whether to recheck today.  Plan:  Continue to monitor blood pressures Ambulate today Needs foley x 1 week Bottle feeding only - pt has tried breast and now wants to do just bottle Undecided about  contraception. Plans for abstinence but is considering nexplanon. Plan for discharge tomorrow.   Levert Feinstein 02/18/2013, 7:47 AM

## 2013-02-18 NOTE — Progress Notes (Signed)
I have seen and examined this patient and I agree with the above. Rev'd pt's request for d/c at rounds today- attending MD agreeable with request and pt will be discharged later this morning. Holly Terrell 8:54 AM 02/18/2013

## 2013-02-19 ENCOUNTER — Other Ambulatory Visit: Payer: Medicaid Other

## 2013-02-19 MED ORDER — SULFAMETHOXAZOLE-TRIMETHOPRIM 800-160 MG PO TABS: 1.0000 | ORAL_TABLET | Freq: Two times a day (BID) | ORAL | Status: DC

## 2013-02-19 MED ORDER — OXYCODONE-ACETAMINOPHEN 5-325 MG PO TABS: 1.0000 | ORAL_TABLET | ORAL | Status: DC | PRN

## 2013-02-19 MED ORDER — IBUPROFEN 600 MG PO TABS: 600.0000 mg | ORAL_TABLET | Freq: Four times a day (QID) | ORAL | Status: DC

## 2013-02-19 MED ORDER — HYDROCHLOROTHIAZIDE 12.5 MG PO TABS: 25.0000 mg | ORAL_TABLET | Freq: Every day | ORAL | Status: DC

## 2013-02-19 NOTE — Progress Notes (Signed)
At beginning of day shift, during patient report I found mother asleep in the chair with infant in her arms propped on top of a pillow.  I woke the mother up and moved the baby to his [place].  I instructed mother on safe sleep practice.  She stated that she "slept with her other child and they did not have a problem." I explained to her that the baby could fall onto the floor or get stuck in the chair or under the pillow.  I also told her about the increased incidence of SIDS in [place] She still stated she did not have a problem with the infant sleeping with her. She was also found to be sleeping with the baby in her bed yesterday and was instructed not sleep with the baby. Informational sheet on safe sleep was give to the mother.

## 2013-02-19 NOTE — Progress Notes (Signed)
Pt discharged to home with Foley Catheter.  Reviewed catheter care with pt including washing catheter with soap and water each day and keeping drainage bag beneath bladder, but off of floor.  Demonstrated how to empty large drainage bag.  Pt performed return demonstration of emptying bag into toilet.  Reviewed use of leg bag including how to apply bag to catheter and to leg; and how to empty it.  Pt declined use of the leg bag, but did take it home in case she wants to use it.

## 2013-02-19 NOTE — Plan of Care (Signed)
Problem: Phase I Progression Outcomes Goal: Voiding adequately Outcome: Not Applicable Date Met:  02/19/13 Patient will go home with foley catheter.

## 2013-02-24 ENCOUNTER — Ambulatory Visit: Payer: Medicaid Other

## 2013-02-24 NOTE — Progress Notes (Signed)
Cesarean incision was closed, clean, dry, and intact.  No odor, redness, or drainage.  Patient had Foley catheter in place which was removed successfully.  Patient was instructed to call the clinic if she is unable to void within 2-3 hours.

## 2013-02-26 DIAGNOSIS — Z98891 History of uterine scar from previous surgery: Secondary | ICD-10-CM

## 2013-02-26 DIAGNOSIS — O1410 Severe pre-eclampsia, unspecified trimester: Secondary | ICD-10-CM

## 2013-02-26 NOTE — Discharge Summary (Signed)
Obstetric Discharge Summary Holly Terrell is a 32 y.o. [id number] presenting at [redacted]w[redacted]d with PPROM and severe preeclampsia. Labor was induced with pitocin and advanced to complete dilation, +1 station. She pushed for over an hour and decision was made to proceed to repeat cesarean section for arrest of descent. Repeat cesarean section complicated by incidental cystotomy, which was repaired. Postop course complicated by oliguria, anemia, and acute renal failure due to ATN. She was transfused a total of 3 units RBCs and received several doses of lasix. She eventually diuresed. She was started on HCTZ to control bloo pressures, which were fairly well controlled by day of discharge (140s/80-90s). She was breast and bottle feeding and was unsure about contraception at time of discharge. She was discharged with a foley and leg bag as well as prophylactic antibiotics (to prevent UTI) and will follow up at 7 days post-op for foley removal.  Reason for Admission: rupture of membranes and severe preeclampsia Prenatal Procedures: none Intrapartum Procedures: cesarean: low cervical, transverse and magnesium for seizure prophylaxis Postpartum Procedures: transfusion 3 units PRBCs Complications-Operative and Postpartum: anemia, cystotomy (repaired) Hemoglobin  Date Value Range Status  02/17/2013 9.5* 12.0 - 15.0 g/dL Final     DELTA CHECK NOTED     REPEATED TO VERIFY     POST TRANSFUSION SPECIMEN     HCT  Date Value Range Status  02/17/2013 28.0* 36.0 - 46.0 % Final    Physical Exam:  General: alert, cooperative and no distress  Lochia: appropriate  Uterine Fundus: difficult to assess due to body habitus and intolerance of exam due to incisional pain  Incision: dressing is clean, dry, and intact  DVT Evaluation: No evidence of DVT seen on physical exam.  Ext: 2+ pitting edema bilaterally   Discharge Diagnoses: Preelampsia and Failed TOLAC, s/p repeat low transverse cesarean section, cystotomy repaired,  anemia  Discharge Information: Date: 02/26/2013 Activity: pelvic rest Diet: routine Medications:    Medication List    STOP taking these medications       acetaminophen 500 MG tablet  Commonly known as:  TYLENOL     calcium carbonate 500 MG chewable tablet  Commonly known as:  TUMS - dosed in mg elemental calcium      TAKE these medications       hydrochlorothiazide 12.5 MG tablet  Commonly known as:  HYDRODIURIL  Take 2 tablets (25 mg total) by mouth daily.     ibuprofen 600 MG tablet  Commonly known as:  ADVIL,MOTRIN  Take 1 tablet (600 mg total) by mouth every 6 (six) hours.     oxyCODONE-acetaminophen 5-325 MG per tablet  Commonly known as:  PERCOCET/ROXICET  Take 1-2 tablets by mouth every 4 (four) hours as needed.     prenatal multivitamin Tabs  Take 1 tablet by mouth daily at 12 noon.     sulfamethoxazole-trimethoprim 800-160 MG per tablet  Commonly known as:  BACTRIM DS  Take 1 tablet by mouth 2 (two) times daily.       Condition: stable Instructions: refer to practice specific booklet Discharge to: home Follow-up Information   Follow up with [facility]. Schedule an appointment as soon as possible for a visit on 02/24/2013. (remove foley)    Contact information:   [address] [place] [address]       Newborn Data: Live born female  Birth Weight: 7 lb 15 oz (3600 g) APGAR: 7, 9  Home with mother.  Napoleon Form 02/26/2013, 9:14 PM

## 2013-03-19 ENCOUNTER — Ambulatory Visit (INDEPENDENT_AMBULATORY_CARE_PROVIDER_SITE_OTHER): Payer: Medicaid Other | Admitting: Family Medicine

## 2013-03-19 VITALS — BP 116/68 | HR 88 | Temp 98.6°F | Ht 59.0 in | Wt 137.0 lb

## 2013-03-19 DIAGNOSIS — IMO0001 Reserved for inherently not codable concepts without codable children: Secondary | ICD-10-CM

## 2013-03-19 DIAGNOSIS — D649 Anemia, unspecified: Secondary | ICD-10-CM

## 2013-03-19 DIAGNOSIS — Z30017 Encounter for initial prescription of implantable subdermal contraceptive: Secondary | ICD-10-CM

## 2013-03-19 MED ORDER — ETONOGESTREL 68 MG ~~LOC~~ IMPL
68.0000 mg | DRUG_IMPLANT | Freq: Once | SUBCUTANEOUS | Status: AC
  Administered 2013-03-19: 68 mg via SUBCUTANEOUS

## 2013-03-19 NOTE — Progress Notes (Signed)
Patient ID: Holly Terrell, female   DOB: Jun 29, 1981, 32 y.o.   MRN: [id number]  Subjective:     Holly Terrell is a 32 y.o. female who presents for a postpartum visit. She is 4 weeks postpartum following a low cervical transverse Cesarean section. I have fully reviewed the prenatal and intrapartum course. The delivery was at [redacted]w[redacted]d gestation. Outcome: repeat cesarean section, low transverse incision. Anesthesia: epidural. Postpartum course has been unremarkable. Baby's course has been normal. Baby is feeding by bottle. Bleeding occasional. Bowel function is normal. Bladder function is abnormal - sometimes comes in spurts. Patient is not sexually active. Contraception method is Nexplanon. Postpartum depression screening: negative.  The following portions of the patient's history were reviewed and updated as appropriate: allergies, current medications, past family history, past medical history, past social history, past surgical history and problem list.  Review of Systems Pertinent items are noted in HPI.   Objective:    BP 116/68  Pulse 88  Temp(Src) 98.6 F (37 C) (Oral)  Ht 4\' 11"  (1.499 m)  Wt 137 lb (62.143 kg)  BMI 27.66 kg/m2  Breastfeeding? No         GEN:  WNWD, no distress HEENT:  NCAT, EOMI, conjunctiva clear NECK:  Supple, non-tender, no thyromegaly, trachea midline CV: RRR, no murmur RESP:  CTAB ABD:  Soft, non-tender, no guarding or rebound, normal bowel sounds, incision healing well, no dehiscence, redness, warmth or drainage EXTREM:  Warm, well perfused, no edema or tenderness NEURO:  Alert, oriented, no focal deficits GU:  Deferred   PROCEDURE NOTE:  NEXPLANON INSERTION Patient given informed consent, signed copy in the chart, time out was performed. Pregnancy test was negative. Appropriate time out taken.  Patient's left arm was prepped and draped in the usual sterile fashion. Insertion area marked 4 finger-breadths proximal to medial epicondyle. Pt was prepped with alcohol  swab and then injected with 3 cc of 1% lidocaine with epinephrine.  Pt was prepped with betadine, Nexplanon removed form packaging,  Device confirmed in needle, then inserted full length of needle and withdrawn per handbook instructions.  Pt insertion site covered with sterile gauze and wrapped with Kerlex.   Minimal blood loss.  Pt tolerated the procedure well. Post-insertion instructions given.  Assessment:    Normal postpartum exam. Pap smear not done at today's visit.  Nexplanon insertion as above.  Plan:    1. Contraception: Nexplanon 2. Moving to [facility], will establish care there for future ob-gyn care

## 2013-03-19 NOTE — Patient Instructions (Signed)

## 2013-03-20 ENCOUNTER — Encounter: Payer: Self-pay | Admitting: Family Medicine

## 2013-03-22 NOTE — Addendum Note (Signed)
Addendum created 03/22/13 2026 by Jiles Garter, MD   Modules edited: Anesthesia Responsible Staff

## 2013-03-27 NOTE — Addendum Note (Signed)
Addendum created 03/27/13 0012 by Jiles Garter, MD   Modules edited: Anesthesia Events

## 2013-03-31 ENCOUNTER — Encounter: Payer: Self-pay | Admitting: *Deleted

## 2013-08-20 ENCOUNTER — Other Ambulatory Visit: Payer: Self-pay

## 2014-08-16 ENCOUNTER — Encounter: Payer: Self-pay | Admitting: Family Medicine

## 2020-05-21 ENCOUNTER — Inpatient Hospital Stay (HOSPITAL_COMMUNITY)
Admission: EM | Admit: 2020-05-21 | Discharge: 2020-05-27 | DRG: 137 | Disposition: A | Discharge status: Home / Self Care | Payer: Medicaid Other | Attending: Student in an Organized Health Care Education/Training Program | Admitting: Student in an Organized Health Care Education/Training Program

## 2020-05-21 ENCOUNTER — Encounter (HOSPITAL_COMMUNITY): Payer: Self-pay | Admitting: Emergency Medicine

## 2020-05-21 ENCOUNTER — Other Ambulatory Visit: Payer: Self-pay

## 2020-05-21 ENCOUNTER — Emergency Department (HOSPITAL_COMMUNITY): Payer: Medicaid Other

## 2020-05-21 DIAGNOSIS — B954 Other streptococcus as the cause of diseases classified elsewhere: Secondary | ICD-10-CM | POA: Diagnosis present

## 2020-05-21 DIAGNOSIS — D62 Acute posthemorrhagic anemia: Secondary | ICD-10-CM | POA: Diagnosis not present

## 2020-05-21 DIAGNOSIS — K029 Dental caries, unspecified: Secondary | ICD-10-CM | POA: Diagnosis present

## 2020-05-21 DIAGNOSIS — R221 Localized swelling, mass and lump, neck: Secondary | ICD-10-CM

## 2020-05-21 DIAGNOSIS — J9601 Acute respiratory failure with hypoxia: Secondary | ICD-10-CM

## 2020-05-21 DIAGNOSIS — R252 Cramp and spasm: Secondary | ICD-10-CM | POA: Diagnosis not present

## 2020-05-21 DIAGNOSIS — Z20822 Contact with and (suspected) exposure to covid-19: Secondary | ICD-10-CM | POA: Diagnosis present

## 2020-05-21 DIAGNOSIS — Z8249 Family history of ischemic heart disease and other diseases of the circulatory system: Secondary | ICD-10-CM

## 2020-05-21 DIAGNOSIS — K122 Cellulitis and abscess of mouth: Principal | ICD-10-CM | POA: Diagnosis present

## 2020-05-21 DIAGNOSIS — Z978 Presence of other specified devices: Secondary | ICD-10-CM

## 2020-05-21 DIAGNOSIS — Z789 Other specified health status: Secondary | ICD-10-CM

## 2020-05-21 DIAGNOSIS — T884XXA Failed or difficult intubation, initial encounter: Secondary | ICD-10-CM | POA: Diagnosis not present

## 2020-05-21 DIAGNOSIS — Z6841 Body Mass Index (BMI) 40.0 and over, adult: Secondary | ICD-10-CM

## 2020-05-21 LAB — COMPREHENSIVE METABOLIC PANEL
ALT: 54 U/L — ABNORMAL HIGH (ref 0–44)
AST: 44 U/L — ABNORMAL HIGH (ref 15–41)
Albumin: 3.3 g/dL — ABNORMAL LOW (ref 3.5–5.0)
Alkaline Phosphatase: 90 U/L (ref 38–126)
Anion gap: 10 (ref 5–15)
BUN: <5 mg/dL — ABNORMAL LOW (ref 6–20)
CO2: 26 mmol/L (ref 22–32)
Calcium: 9.1 mg/dL (ref 8.9–10.3)
Chloride: 103 mmol/L (ref 98–111)
Creatinine, Ser: 0.84 mg/dL (ref 0.44–1.00)
GFR calc Af Amer: >60 mL/min (ref >60)
GFR calc non Af Amer: >60 mL/min (ref >60)
Glucose, Bld: 126 mg/dL — ABNORMAL HIGH (ref 70–99)
Potassium: 3.7 mmol/L (ref 3.5–5.1)
Sodium: 139 mmol/L (ref 135–145)
Total Bilirubin: 0.7 mg/dL (ref 0.3–1.2)
Total Protein: 7 g/dL (ref 6.5–8.1)

## 2020-05-21 LAB — CBC WITH DIFFERENTIAL/PLATELET
Abs Immature Granulocytes: 0.06 10*3/uL (ref 0.00–0.07)
Basophils Absolute: 0 10*3/uL (ref 0.0–0.1)
Basophils Relative: 1 %
Eosinophils Absolute: 0.2 10*3/uL (ref 0.0–0.5)
Eosinophils Relative: 2 %
HCT: 38.8 % (ref 36.0–46.0)
Hemoglobin: 12.2 g/dL (ref 12.0–15.0)
Immature Granulocytes: 1 %
Lymphocytes Relative: 26 %
Lymphs Abs: 2.1 10*3/uL (ref 0.7–4.0)
MCH: 27.3 pg (ref 26.0–34.0)
MCHC: 31.4 g/dL (ref 30.0–36.0)
MCV: 86.8 fL (ref 80.0–100.0)
Monocytes Absolute: 0.5 10*3/uL (ref 0.1–1.0)
Monocytes Relative: 6 %
Neutro Abs: 5.4 10*3/uL (ref 1.7–7.7)
Neutrophils Relative %: 64 %
Platelets: 240 10*3/uL (ref 150–400)
RBC: 4.47 MIL/uL (ref 3.87–5.11)
RDW: 13.4 % (ref 11.5–15.5)
WBC: 8.3 10*3/uL (ref 4.0–10.5)
nRBC: 0 % (ref 0.0–0.2)

## 2020-05-21 LAB — SARS CORONAVIRUS 2 BY RT PCR (HOSPITAL ORDER, PERFORMED IN ~~LOC~~ HOSPITAL LAB): SARS Coronavirus 2: NEGATIVE

## 2020-05-21 LAB — LACTIC ACID, PLASMA
Lactic Acid, Venous: 2 mmol/L (ref 0.5–1.9)
Lactic Acid, Venous: 1 mmol/L (ref 0.5–1.9)

## 2020-05-21 LAB — I-STAT BETA HCG BLOOD, ED (MC, WL, AP ONLY): I-stat hCG, quantitative: <5 m[IU]/mL (ref <5)

## 2020-05-21 LAB — HIV ANTIBODY (ROUTINE TESTING W REFLEX): HIV Screen 4th Generation wRfx: NONREACTIVE

## 2020-05-21 LAB — GROUP A STREP BY PCR: Group A Strep by PCR: NOT DETECTED

## 2020-05-21 MED ORDER — POLYETHYLENE GLYCOL 3350 17 G PO PACK: 17.0000 g | PACK | Freq: Every day | ORAL | Status: DC | PRN

## 2020-05-21 MED ORDER — ACETAMINOPHEN 325 MG PO TABS
650.0000 mg | ORAL_TABLET | Freq: Four times a day (QID) | ORAL | Status: DC | PRN
  Administered 2020-05-21: 650 mg via ORAL
  Filled 2020-05-21: qty 2

## 2020-05-21 MED ORDER — METRONIDAZOLE IN NACL 5-0.79 MG/ML-% IV SOLN: 500.0000 mg | Freq: Three times a day (TID) | INTRAVENOUS | Status: DC

## 2020-05-21 MED ORDER — SODIUM CHLORIDE 0.9 % IV BOLUS
1000.0000 mL | Freq: Once | INTRAVENOUS | Status: AC
  Administered 2020-05-21: 1000 mL via INTRAVENOUS

## 2020-05-21 MED ORDER — FENTANYL CITRATE (PF) 100 MCG/2ML IJ SOLN
100.0000 ug | Freq: Once | INTRAMUSCULAR | Status: AC
  Administered 2020-05-21: 100 ug via INTRAVENOUS
  Filled 2020-05-21: qty 2

## 2020-05-21 MED ORDER — ONDANSETRON HCL 4 MG/2ML IJ SOLN: 4.0000 mg | Freq: Four times a day (QID) | INTRAMUSCULAR | Status: DC | PRN

## 2020-05-21 MED ORDER — DEXAMETHASONE SODIUM PHOSPHATE 10 MG/ML IJ SOLN
10.0000 mg | Freq: Once | INTRAMUSCULAR | Status: AC
  Administered 2020-05-21: 10 mg via INTRAVENOUS
  Filled 2020-05-21: qty 1

## 2020-05-21 MED ORDER — KETOROLAC TROMETHAMINE 15 MG/ML IJ SOLN
15.0000 mg | Freq: Once | INTRAMUSCULAR | Status: AC
  Administered 2020-05-21: 15 mg via INTRAVENOUS
  Filled 2020-05-21: qty 1

## 2020-05-21 MED ORDER — IOHEXOL 300 MG/ML  SOLN
75.0000 mL | Freq: Once | INTRAMUSCULAR | Status: AC | PRN
  Administered 2020-05-21: 75 mL via INTRAVENOUS

## 2020-05-21 MED ORDER — ONDANSETRON HCL 4 MG PO TABS: 4.0000 mg | ORAL_TABLET | Freq: Four times a day (QID) | ORAL | Status: DC | PRN

## 2020-05-21 MED ORDER — CLINDAMYCIN PHOSPHATE 600 MG/50ML IV SOLN
600.0000 mg | Freq: Once | INTRAVENOUS | Status: AC
  Administered 2020-05-21: 600 mg via INTRAVENOUS
  Filled 2020-05-21: qty 50

## 2020-05-21 MED ORDER — ACETAMINOPHEN 650 MG RE SUPP: 650.0000 mg | Freq: Four times a day (QID) | RECTAL | Status: DC | PRN

## 2020-05-21 MED ORDER — SODIUM CHLORIDE 0.9 % IV SOLN: 2.0000 g | INTRAVENOUS | Status: DC

## 2020-05-21 MED ORDER — SODIUM CHLORIDE 0.9% FLUSH
3.0000 mL | Freq: Once | INTRAVENOUS | Status: AC
  Administered 2020-05-21: 3 mL via INTRAVENOUS

## 2020-05-21 MED ORDER — ENOXAPARIN SODIUM 60 MG/0.6ML ~~LOC~~ SOLN
50.0000 mg | SUBCUTANEOUS | Status: DC
  Administered 2020-05-24 – 2020-05-27 (×4): 50 mg via SUBCUTANEOUS
  Filled 2020-05-21 (×7): qty 0.6

## 2020-05-21 MED ORDER — SODIUM CHLORIDE 0.9 % IV SOLN
3.0000 g | Freq: Four times a day (QID) | INTRAVENOUS | Status: DC
  Administered 2020-05-21 – 2020-05-22 (×6): 3 g via INTRAVENOUS
  Administered 2020-05-23: 3 mL via INTRAVENOUS
  Administered 2020-05-23 – 2020-05-26 (×13): 3 g via INTRAVENOUS
  Filled 2020-05-21 (×8): qty 8
  Filled 2020-05-21: qty 3
  Filled 2020-05-21 (×15): qty 8
  Filled 2020-05-21: qty 3
  Filled 2020-05-21: qty 8

## 2020-05-21 NOTE — ED Triage Notes (Signed)
Pt in with R lower mouth dental pain/infection. States she has been on Amoxicillin x 1 wk for trx, notable swelling to R neck. Painful to swallow now, denies any fevers, HR 115

## 2020-05-21 NOTE — H&P (Addendum)
Date: 05/21/2020               Patient Name:  Holly Terrell MRN: [id number]  DOB: 1981-07-04 Age / Sex: 39 y.o., female   PCP: Patient, No Pcp Per         Medical Service: Internal Medicine Teaching Service         Attending Physician: Dr. Mayford Knife, Dorene Ar, MD    First Contact: Dr. Arna Snipe Pager: [phone]  Second Contact: Dr. Judeth Cornfield Pager: [phone]       After Hours (After 5p/  First Contact Pager: [phone]  weekends / holidays): Second Contact Pager: [phone]   Chief Complaint: Neck swelling and pain  History of Present Illness: Holly Terrell is a 39 yr old female who presents with right neck swelling and pain. She reports that about 3 weeks she started having tooth pain on the lower Right jaw. Than a few days later began to have neck pain and swelling. She went to an urgent care and was prescribed a week of Augmentin. She reports finishing the course but that it did not help. She states she has not been able to eat solid foods for a while now and only can eat soft foods such as jello and has been able to drink plenty of fluids. She reports no issues with breathing. She reports that Ibuprofen would temporarily help with the pain but would not last. Has pain moving her neck and opening her mouth.  She reports breaking a tooth on the Right lower jaw a few months ago, but did not see anyone about this.  ROS: Positive for- mild fevers, diarrhea (after taking the Augmentin), neck pain, weakness, loss of sleep (only able to sleep ~4 hours a night) Negative for- nausea and vomiting  Meds: Current Meds  Medication Sig  . amoxicillin-clavulanate (AUGMENTIN) 875-125 MG tablet Take 1 tablet by mouth every 12 (twelve) hours.  Marland Kitchen ibuprofen (ADVIL) 200 MG tablet Take 800 mg by mouth 3 (three) times daily as needed (pain).     Allergies: Allergies as of 05/21/2020  . (No Known Allergies)   Past Medical History:  Diagnosis Date  . Medical history non-contributory   . No pertinent  past medical history   . Pregnancy induced hypertension   . Urinary tract infection     Family History: Does not know her father Mother 53- heart issues, anxiety, depression Half sister 85- healthy 2 sons 3- health 7- heart murmur  Social History: No alcohol, former social smoker (stopped 3 years ago), no illicit drugs (did smoke marijuana but has not in many years) Lives around [place], lives with fiance and 2 sons  Review of Systems: A complete ROS was negative except as per HPI.   Physical Exam: Blood pressure 125/89, pulse 84, temperature 97.8 F (36.6 C), temperature source Oral, resp. rate 18, height 5' (1.524 m), weight 94.8 kg, last menstrual period 05/07/2020, SpO2 100 %.  Physical Exam Constitutional:      General: She is not in acute distress.    Appearance: Normal appearance. She is not ill-appearing or toxic-appearing.  HENT:     Head: Normocephalic and atraumatic.     Mouth/Throat:     Comments: Unable to assess due to severe pain with opening mouth Eyes:     Extraocular Movements: Extraocular movements intact.  Neck:      Comments: Pain and swelling start at the angle of the mandible crossing to past midline on the front of the  throat Cardiovascular:     Rate and Rhythm: Normal rate and regular rhythm.     Pulses: Normal pulses.     Heart sounds: No murmur heard.   Pulmonary:     Effort: Pulmonary effort is normal. No respiratory distress.     Breath sounds: Normal breath sounds. No wheezing.  Abdominal:     General: There is distension (due to body habitus).     Palpations: Abdomen is soft. There is no mass.     Tenderness: There is no abdominal tenderness.  Musculoskeletal:     Cervical back: Erythema and tenderness present. Pain with movement present.     Right lower leg: No edema.     Left lower leg: No edema.  Neurological:     General: No focal deficit present.     Mental Status: She is alert. Mental status is at baseline.      EKG:  personally reviewed my interpretation is none taken  CXR: personally reviewed my interpretation is none taken  Assessment & Plan by Problem: Active Problems:   Submandibular abscess  Holly Terrell is a 39 yr old female with a PMHx of depression. She presents with severe right neck pain, erythema, and swelling.   Submandibular Abscess: Reports swelling started about 2.5 weeks ago. About 2 weeks ago she was seen at an urgent care and was given one week of Augmentin which she completed. CT Soft Tissue Neck W Contrast showed 3.2 x 3.0 x 1.6 cm peripherally enhancing collection within the right submandibular space consistent with abscess. Surrounding cellulitis. Additionally, subtle edema is questioned within the right posterior floor of mouth suspicious for associated floor of mouth infection. The right posterior mandibular molar is carious and findings may be odontogenic in origin.  -Given Clindamycin IV 600 mg -Ketorolac 15 mg IV given for pain -Start Ampicillin-Sulbactam 3 g IV q6 -Consulted Dentistry  Addendum: Notified that ENT does not have on-call oral surgeon available until Tuesday (05/24/20). Spoke with Dr.Locklear, dentist on call, who suggest observation admission on IV unasyn. Advised to be f/u outpatient on 05/23/20 at his office if able to be discharged prior to that date.  Dispo: Admit patient to Observation with expected length of stay less than 2 midnights.  Signed: Lauro Franklin, MD 05/21/2020, 3:33 PM  Pager: [phone] After 5pm on weekdays and 1pm on weekends: On Call pager: [phone]

## 2020-05-21 NOTE — ED Notes (Signed)
Attempted to call nursing report to 6N 

## 2020-05-21 NOTE — ED Provider Notes (Signed)
[facility] EMERGENCY DEPARTMENT Provider Note   CSN: [id number] Arrival date & time: 05/21/20  0754     History Chief Complaint  Patient presents with  . Dental Pain  . Neck Swelling    Eleanna Theilen is a 39 y.o. female presents to the ED for evaluation of right sided neck pain associated with swelling, redness, warmth, chills for the last 1 week.  Patient went to urgent care on 7/28 for right lower dental pain.  He was prescribed Augmentin and has been compliant.  States a dental pain is now minimal but she has since developed significant swelling in the lower aspect of her chin and neck.  Also has mild pain with swallowing in her throat.  Cannot open her mouth to eat and has had less oral intake of fluid and food.  Denies any cough, congestion, ear pain.  No chest pain.  No shortness of breath.  On vaccinated for COVID.  Status post tonsillectomy.  HPI     Past Medical History:  Diagnosis Date  . Medical history non-contributory   . No pertinent past medical history   . Pregnancy induced hypertension   . Urinary tract infection     Patient Active Problem List   Diagnosis Date Noted  . Severe preeclampsia 02/26/2013  . LGA (large for gestational age) fetus 02/11/2013  . Elevated serum creatinine 02/11/2013  . Abnormal glucose tolerance test in pregnancy, antepartum 12/18/2012  . Previous cesarean delivery, antepartum condition or complication 11/19/2012    Past Surgical History:  Procedure Laterality Date  . CESAREAN SECTION    . CESAREAN SECTION N/A 02/16/2013   Procedure: Repeat CESAREAN SECTION of baby boy at 0221 APGAR 7/9;  Surgeon: Tilda Burrow, MD;  Location: [facility];  Service: Obstetrics;  Laterality: N/A;  . TONSILLECTOMY       OB History    Gravida  2   Para  2   Term  1   Preterm  1   AB  0   Living  2     SAB  0   TAB  0   Ectopic  0   Multiple  0   Live Births  2           Family History  Problem Relation Age of  Onset  . Hypertension Mother   . Birth defects Son     Social History   Tobacco Use  . Smoking status: Never Smoker  . Smokeless tobacco: Never Used  Substance Use Topics  . Alcohol use: No  . Drug use: No    Home Medications Prior to Admission medications   Medication Sig Start Date End Date Taking? Authorizing Provider  hydrochlorothiazide (HYDRODIURIL) 12.5 MG tablet Take 2 tablets (25 mg total) by mouth daily. 02/19/13   Lazaro Arms, MD  ibuprofen (ADVIL,MOTRIN) 600 MG tablet Take 1 tablet (600 mg total) by mouth every 6 (six) hours. 02/19/13   Lazaro Arms, MD  oxyCODONE-acetaminophen (PERCOCET/ROXICET) 5-325 MG per tablet Take 1-2 tablets by mouth every 4 (four) hours as needed. 02/19/13   Lazaro Arms, MD  Prenatal Vit-Fe Fumarate-FA (PRENATAL MULTIVITAMIN) TABS Take 1 tablet by mouth daily at 12 noon.    [provider]  sulfamethoxazole-trimethoprim (BACTRIM DS) 800-160 MG per tablet Take 1 tablet by mouth 2 (two) times daily. 02/19/13   Lazaro Arms, MD    Allergies    Patient has no known allergies.  Review of Systems  Review of Systems  Constitutional: Positive for chills.  HENT: Positive for dental problem, sore throat and trouble swallowing.   Musculoskeletal: Positive for neck pain (and swelling).  All other systems reviewed and are negative.   Physical Exam Updated Vital Signs BP 125/89   Pulse 84   Temp 97.8 F (36.6 C) (Oral)   Resp 18   Ht 5' (1.524 m)   Wt 94.8 kg   LMP 05/07/2020   SpO2 100%   BMI 40.82 kg/m   Physical Exam Vitals and nursing note reviewed.  Constitutional:      General: She is not in acute distress.    Appearance: She is well-developed.     Comments: NAD.  HENT:     Head: Normocephalic and atraumatic.     Right Ear: External ear normal.     Left Ear: External ear normal.     Ears:     Comments: TM normal. Normal mastoids, non tender, no edema     Nose: Nose normal.     Mouth/Throat:     Comments: 1  finger width trismus.  Throat exam limited due to trismus.  Able to visualize right lower dentition and gumline, no obvious focal edema or dental abscess in gingiva or buccal or lingual mucosa.  Visualized top of soft palate and upper oropharynx, no edema, exudates or obvious asymmetric tonsillar pillar edema or abscess. Uvula midline. Unable to palpate or visualize SL space due to trismus. Patient cannot stick tongue out due to pain. Tolerating secretions. Normal voice, no hot potato voice. No drooling. No stridor.  Eyes:     General: No scleral icterus.    Conjunctiva/sclera: Conjunctivae normal.  Neck:     Comments: Marked submental and submandibular edema, warmth, erythema, firmness mostly right sided. Trachea midline.  Patient with mild pain but with full neck F/E/rotation and bend.  No posterior neck tenderness or edema.  Cardiovascular:     Rate and Rhythm: Normal rate and regular rhythm.     Heart sounds: Normal heart sounds. No murmur heard.   Pulmonary:     Effort: Pulmonary effort is normal.     Breath sounds: Normal breath sounds. No wheezing.  Musculoskeletal:        General: No deformity. Normal range of motion.     Cervical back: Normal range of motion and neck supple.  Skin:    General: Skin is warm and dry.     Capillary Refill: Capillary refill takes less than 2 seconds.  Neurological:     Mental Status: She is alert and oriented to person, place, and time.  Psychiatric:        Behavior: Behavior normal.        Thought Content: Thought content normal.        Judgment: Judgment normal.         ED Results / Procedures / Treatments   Labs (all labs ordered are listed, but only abnormal results are displayed) Labs Reviewed  LACTIC ACID, PLASMA - Abnormal; Notable for the following components:      Result Value   Lactic Acid, Venous 2.0 (*)    All other components within normal limits  COMPREHENSIVE METABOLIC PANEL - Abnormal; Notable for the following components:     Glucose, Bld 126 (*)    BUN <5 (*)    Albumin 3.3 (*)    AST 44 (*)    ALT 54 (*)    All other components within normal limits  GROUP A STREP  BY PCR  SARS CORONAVIRUS 2 BY RT PCR (HOSPITAL ORDER, PERFORMED IN [facility])  LACTIC ACID, PLASMA  CBC WITH DIFFERENTIAL/PLATELET  I-STAT BETA HCG BLOOD, ED ([facility], [facility], [facility] ONLY)    EKG None  Radiology CT Soft Tissue Neck W Contrast  Result Date: 05/21/2020 CLINICAL DATA:  Cellulitis, neck; recent dental infection on Augmentin, significant right-sided neck and submandibular swelling, edema, trismus. EXAM: CT NECK WITH CONTRAST TECHNIQUE: Multidetector CT imaging of the neck was performed using the standard protocol following the bolus administration of intravenous contrast. CONTRAST:  75mL OMNIPAQUE IOHEXOL 300 MG/ML  SOLN COMPARISON:  No pertinent prior exams are available for comparison. FINDINGS: Pharynx and larynx: Mild edema is questioned within the posterior floor of mouth. There is no appreciable swelling or discrete mass within the pharynx or larynx. Salivary glands: The parotid glands are unremarkable. The right submandibular gland is posteriorly displaced by a right submandibular space peripherally enhancing fluid collection consistent with abscess. This collection measures 3.2 x 3.0 x 1.6 cm (AP x TV x CC). There is surrounding inflammatory stranding consistent with cellulitis. The left submandibular gland is unremarkable. The posterior right mandibular molar is carious (series 6, image 34). Thyroid: Unremarkable. Lymph nodes: No pathologically enlarged cervical chain lymph nodes are identified. Vascular: The major vascular structures of the neck are patent. Limited intracranial: No acute intracranial abnormality identified. Visualized orbits: Visualized orbits show no acute finding. Mastoids and visualized paranasal sinuses: No significant paranasal sinus disease or mastoid effusion at the imaged levels. Skeleton: No acute bony  abnormality or aggressive osseous lesion. Upper chest: No consolidation within the imaged lung apices. Other: 9 mm nonspecific cutaneous/subcutaneous lesion within the right cheek (series 3, image 41). These results were called by telephone at the time of interpretation on 05/21/2020 at 1:15 pm to provider Marquise Lambson , who verbally acknowledged these results. IMPRESSION: 3.2 x 3.0 x 1.6 cm peripherally enhancing collection within the right submandibular space consistent with abscess. Surrounding cellulitis. Additionally, subtle edema is questioned within the right posterior floor of mouth suspicious for associated floor of mouth infection. The right posterior mandibular molar is carious and findings may be odontogenic in origin. 9 mm nonspecific cutaneous/subcutaneous lesion of the right cheek. Direct visualization recommended. Electronically Signed   By: Jackey LogeKyle  Golden DO   On: 05/21/2020 13:16    Procedures Procedures (including critical care time)  Medications Ordered in ED Medications  sodium chloride flush (NS) 0.9 % injection 3 mL (has no administration in time range)  dexamethasone (DECADRON) injection 10 mg (10 mg Intravenous Given 05/21/20 1227)  clindamycin (CLEOCIN) IVPB 600 mg (0 mg Intravenous Stopped 05/21/20 1345)  fentaNYL (SUBLIMAZE) injection 100 mcg (100 mcg Intravenous Given 05/21/20 1229)  sodium chloride 0.9 % bolus 1,000 mL (1,000 mLs Intravenous New Bag/Given 05/21/20 1346)  iohexol (OMNIPAQUE) 300 MG/ML solution 75 mL (75 mLs Intravenous Contrast Given 05/21/20 1255)    ED Course  I have reviewed the triage vital signs and the nursing notes.  Pertinent labs & imaging results that were available during my care of the patient were reviewed by me and considered in my medical decision making (see chart for details).  Clinical Course as of May 22 1423  Sat May 21, 2020  [id number] 39 year old female here with dental pain and neck swelling that is been going on almost a few weeks now.  She  is on oral antibiotics without any improvement.  She is unable to open her mouth very much.  Tender red  underneath her chin.  Needs IV antibiotics and CT imaging.   [MB]  1250 WBC: 8.3 [CG]  1250 2.0 > 1.0 without interventions  Lactic Acid, Venous: 1.0 [CG]  1251 Group A Strep by PCR: NOT DETECTED [CG]  1251 Pulse Rate(!): 115 [CG]  1251 Pulse Rate: 84 [CG]  1251 Temp: 97.8 F (36.6 C) [CG]  1320 IMPRESSION: 3.2 x 3.0 x 1.6 cm peripherally enhancing collection within the right submandibular space consistent with abscess. Surrounding cellulitis. Additionally, subtle edema is questioned within the right posterior floor of mouth suspicious for associated floor of mouth infection. The right posterior mandibular molar is carious and findings may be odontogenic in origin.  9 mm nonspecific cutaneous/subcutaneous lesion of the right cheek. Direct visualization recommended.  CT Soft Tissue Neck W Contrast [CG]  1346 Spoke to ENT Dr Annalee Genta states ENT is the wrong service for this patient. Needs oral surgery evaluation.  Recommends admission, findings oral surgeon to evaluate patient. Discussed this with EDP Charm Barges and EDP Ray. Dr Rosalia Hammers has contacted ENT service herself.  Will admit.    [CG]    Clinical Course User Index [CG] Liberty Handy, PA-C [MB] Terrilee Files, MD   MDM Rules/Calculators/A&P                          Ddx includes deep neck infection like peritonsillar abscess, submandibular or sublingual abscess. Airway is open and no red flags on exam like drooling, stridor, muffled voice. No fever.    ER work up including labs initiated in triage, these were personally reviewed and interpreted.   Normal WBC. Lactic 2.0. initial tachycardia HR 115, resolved without intervention. No other clinical criteria for SIRS/Sepsis.    I have ordered repeat lactic, group A strep, COVID.   Ordered IVF, fentanyl, clindamycin, decadron for pain and inflammation.  CT neck ordered.    1420: Pt re-evaluated reports mild improvement in pain.  No clinical decline on repeat exam.    CT personally visualized and interpreted by me.  CT shows 3 x 3 x 1.6 cm right submandibular abscess with cellulitis.    No oral surgeon on call today. Consulted ENT Dr Annalee Genta, recommends deferring to oral surgeon.   Discussed case with EDPs Charm Barges and Ray.  Will attempt to confirm specialist care from ER.    Patient admitted to IM service for further management, possible drainage.    Final Clinical Impression(s) / ED Diagnoses Final diagnoses:  Submandibular abscess    Rx / DC Orders ED Discharge Orders    None       Liberty Handy, PA-C 05/21/20 1424    Terrilee Files, MD 05/21/20 1747

## 2020-05-21 NOTE — ED Notes (Signed)
Pt refusing ordered COVID swab

## 2020-05-22 ENCOUNTER — Other Ambulatory Visit: Payer: Self-pay

## 2020-05-22 DIAGNOSIS — M542 Cervicalgia: Secondary | ICD-10-CM | POA: Diagnosis not present

## 2020-05-22 DIAGNOSIS — Z8249 Family history of ischemic heart disease and other diseases of the circulatory system: Secondary | ICD-10-CM | POA: Diagnosis not present

## 2020-05-22 DIAGNOSIS — J9601 Acute respiratory failure with hypoxia: Secondary | ICD-10-CM | POA: Diagnosis not present

## 2020-05-22 DIAGNOSIS — Z20822 Contact with and (suspected) exposure to covid-19: Secondary | ICD-10-CM | POA: Diagnosis present

## 2020-05-22 DIAGNOSIS — B954 Other streptococcus as the cause of diseases classified elsewhere: Secondary | ICD-10-CM | POA: Diagnosis present

## 2020-05-22 DIAGNOSIS — K122 Cellulitis and abscess of mouth: Secondary | ICD-10-CM | POA: Diagnosis present

## 2020-05-22 DIAGNOSIS — Z6841 Body Mass Index (BMI) 40.0 and over, adult: Secondary | ICD-10-CM | POA: Diagnosis not present

## 2020-05-22 DIAGNOSIS — B9689 Other specified bacterial agents as the cause of diseases classified elsewhere: Secondary | ICD-10-CM | POA: Diagnosis not present

## 2020-05-22 DIAGNOSIS — D62 Acute posthemorrhagic anemia: Secondary | ICD-10-CM | POA: Diagnosis not present

## 2020-05-22 DIAGNOSIS — R252 Cramp and spasm: Secondary | ICD-10-CM | POA: Diagnosis not present

## 2020-05-22 DIAGNOSIS — Z978 Presence of other specified devices: Secondary | ICD-10-CM | POA: Diagnosis not present

## 2020-05-22 DIAGNOSIS — K029 Dental caries, unspecified: Secondary | ICD-10-CM | POA: Diagnosis present

## 2020-05-22 DIAGNOSIS — T884XXA Failed or difficult intubation, initial encounter: Secondary | ICD-10-CM | POA: Diagnosis not present

## 2020-05-22 DIAGNOSIS — R221 Localized swelling, mass and lump, neck: Secondary | ICD-10-CM | POA: Diagnosis not present

## 2020-05-22 DIAGNOSIS — K9189 Other postprocedural complications and disorders of digestive system: Secondary | ICD-10-CM | POA: Diagnosis not present

## 2020-05-22 LAB — BASIC METABOLIC PANEL
Anion gap: 11 (ref 5–15)
BUN: 5 mg/dL — ABNORMAL LOW (ref 6–20)
CO2: 22 mmol/L (ref 22–32)
Calcium: 8.9 mg/dL (ref 8.9–10.3)
Chloride: 104 mmol/L (ref 98–111)
Creatinine, Ser: 0.67 mg/dL (ref 0.44–1.00)
GFR calc Af Amer: >60 mL/min (ref >60)
GFR calc non Af Amer: >60 mL/min (ref >60)
Glucose, Bld: 123 mg/dL — ABNORMAL HIGH (ref 70–99)
Potassium: 4.1 mmol/L (ref 3.5–5.1)
Sodium: 137 mmol/L (ref 135–145)

## 2020-05-22 LAB — CBC
HCT: 37 % (ref 36.0–46.0)
Hemoglobin: 12.1 g/dL (ref 12.0–15.0)
MCH: 27.9 pg (ref 26.0–34.0)
MCHC: 32.7 g/dL (ref 30.0–36.0)
MCV: 85.5 fL (ref 80.0–100.0)
Platelets: 258 10*3/uL (ref 150–400)
RBC: 4.33 MIL/uL (ref 3.87–5.11)
RDW: 13.3 % (ref 11.5–15.5)
WBC: 9.9 10*3/uL (ref 4.0–10.5)
nRBC: 0 % (ref 0.0–0.2)

## 2020-05-22 LAB — GLUCOSE, CAPILLARY: Glucose-Capillary: 136 mg/dL — ABNORMAL HIGH (ref 70–99)

## 2020-05-22 MED ORDER — OXYCODONE HCL 5 MG/5ML PO SOLN
5.0000 mg | Freq: Four times a day (QID) | ORAL | Status: DC | PRN
  Administered 2020-05-22 – 2020-05-27 (×13): 5 mg via ORAL
  Filled 2020-05-22 (×13): qty 5

## 2020-05-22 MED ORDER — ACETAMINOPHEN 160 MG/5ML PO SOLN: 500.0000 mg | Freq: Four times a day (QID) | ORAL | Status: DC

## 2020-05-22 MED ORDER — ACETAMINOPHEN 160 MG/5ML PO SOLN
325.0000 mg | Freq: Four times a day (QID) | ORAL | Status: DC
  Administered 2020-05-22 – 2020-05-27 (×16): 325 mg via ORAL
  Filled 2020-05-22 (×17): qty 20.3

## 2020-05-22 MED ORDER — KETOROLAC TROMETHAMINE 30 MG/ML IJ SOLN
30.0000 mg | Freq: Once | INTRAMUSCULAR | Status: AC
  Administered 2020-05-22: 30 mg via INTRAVENOUS
  Filled 2020-05-22: qty 1

## 2020-05-22 MED ORDER — OXYCODONE HCL 5 MG/5ML PO SOLN
5.0000 mg | Freq: Once | ORAL | Status: AC
  Administered 2020-05-22: 5 mg via ORAL
  Filled 2020-05-22: qty 5

## 2020-05-22 NOTE — Hospital Course (Addendum)
-  feeling okay this morning, able to eat a banana this AM -will try to give oral ABX -discussed with patient she will be able to leave once she can tolerate po abx -site stills tender, most pai

## 2020-05-22 NOTE — Progress Notes (Signed)
Subjective: Interviewed patient at bedside.  She continuing to endorse significant pain. Improved slightly with oxycodone. She states she feels her mass has reduced in size since yesterday after receiving IV antibiotics. She mentions she also feels she is able to open her mouth more than yesterday. Denies any fevers, chills, nausea, vomiting. Continues to endorse headaches.  Discussed plan to keep her here and continue IV antibiotics due to failure of oral antibiotics previously. We will consult Maxillofacial Surgery tomorrow morning.  She is in agreement with this plan.  Objective:  Vital signs in last 24 hours: Vitals:   05/21/20 1534 05/21/20 1602 05/21/20 2112 05/22/20 0118  BP: 120/70 135/81 123/87 103/60  Pulse: (!) 103 93 (!) 103 82  Resp: 16  16 18   Temp: 98.5 F (36.9 C) 98.6 F (37 C) 98.4 F (36.9 C) 98.4 F (36.9 C)  TempSrc: Oral Oral Oral Oral  SpO2: 98% 98% 97% 96%  Weight:      Height:       Physical Exam Vitals and nursing note reviewed.  Constitutional:      General: She is not in acute distress.    Appearance: She is obese. She is not ill-appearing or toxic-appearing.  HENT:     Mouth/Throat:     Comments: Only able to open mouth a few centimeters due to pain Neck:      Comments: Tenderness extends to the back of the neck Cardiovascular:     Rate and Rhythm: Normal rate and regular rhythm.     Pulses: Normal pulses.          Radial pulses are 2+ on the right side and 2+ on the left side.       Dorsalis pedis pulses are 2+ on the right side and 2+ on the left side.     Heart sounds: Normal heart sounds, S1 normal and S2 normal.  Pulmonary:     Effort: Pulmonary effort is normal. No respiratory distress.     Breath sounds: Normal breath sounds. No wheezing.  Musculoskeletal:     Cervical back: Erythema and tenderness present. Pain with movement present. Decreased range of motion (due to pain).     Right lower leg: No edema.     Left lower leg: No  edema.  Neurological:     General: No focal deficit present.     Mental Status: She is alert. Mental status is at baseline.  Psychiatric:        Mood and Affect: Mood normal.        Behavior: Behavior normal.        Thought Content: Thought content normal.      Assessment/Plan:  Active Problems:   Submandibular abscess   Holly Terrell is a 39 yr old female with a PMHx of depression. She presents with severe right neck pain, erythema, and swelling.   Submandibular Abscess: Reports minor improvement overnight since starting the IV antibiotics, will continue with Unasyn. Have changed pain medication to better control. Will consult Maxillofacial surgery tomorrow morning. Continue to monitor for airway compromise. Able to drink fluids over night and this morning, still unable to eat solid food due to pain limiting jaw opening. -Continue Unasyn 3 g q6 -Start Tylenol (160mg /69ml) 325 mg q6 -Start Oxycodone (5mg /18ml) 5 mg q6 PRN breakthrough pain -Consult Maxillofacial surgery tomorrow morning   Diet:full 4m Fluids:None   Prior to Admission Living Arrangement: Home Anticipated Discharge Location: Home Barriers to Discharge: Surgery  Dispo: Anticipated discharge in approximately  2-3 day(s).   Lauro Franklin, MD 05/22/2020, 8:59 AM Pager: [phone] After 5pm on weekdays and 1pm on weekends: On Call pager [phone]

## 2020-05-23 ENCOUNTER — Encounter (HOSPITAL_COMMUNITY)
Admission: EM | Disposition: A | Discharge status: Home / Self Care | Payer: Self-pay | Source: Home / Self Care | Attending: Student in an Organized Health Care Education/Training Program

## 2020-05-23 ENCOUNTER — Inpatient Hospital Stay (HOSPITAL_COMMUNITY): Payer: Medicaid Other | Admitting: Certified Registered"

## 2020-05-23 ENCOUNTER — Inpatient Hospital Stay (HOSPITAL_COMMUNITY): Payer: Medicaid Other

## 2020-05-23 ENCOUNTER — Encounter (HOSPITAL_COMMUNITY): Payer: Self-pay | Admitting: Internal Medicine

## 2020-05-23 DIAGNOSIS — K122 Cellulitis and abscess of mouth: Principal | ICD-10-CM

## 2020-05-23 DIAGNOSIS — M542 Cervicalgia: Secondary | ICD-10-CM

## 2020-05-23 DIAGNOSIS — Z978 Presence of other specified devices: Secondary | ICD-10-CM

## 2020-05-23 HISTORY — PX: TOOTH EXTRACTION: SHX859

## 2020-05-23 HISTORY — PX: INCISION AND DRAINAGE ABSCESS: SHX5864

## 2020-05-23 LAB — POCT I-STAT 7, (LYTES, BLD GAS, ICA,H+H)
Acid-Base Excess: 1 mmol/L (ref 0.0–2.0)
Bicarbonate: 26.9 mmol/L (ref 20.0–28.0)
Calcium, Ion: 1.17 mmol/L (ref 1.15–1.40)
HCT: 36 % (ref 36.0–46.0)
Hemoglobin: 12.2 g/dL (ref 12.0–15.0)
O2 Saturation: 99 %
Patient temperature: 98
Potassium: 4.4 mmol/L (ref 3.5–5.1)
Sodium: 139 mmol/L (ref 135–145)
TCO2: 28 mmol/L (ref 22–32)
pCO2 arterial: 45.1 mmHg (ref 32.0–48.0)
pH, Arterial: 7.383 (ref 7.350–7.450)
pO2, Arterial: 149 mmHg — ABNORMAL HIGH (ref 83.0–108.0)

## 2020-05-23 LAB — COMPREHENSIVE METABOLIC PANEL
ALT: 71 U/L — ABNORMAL HIGH (ref 0–44)
AST: 42 U/L — ABNORMAL HIGH (ref 15–41)
Albumin: 2.9 g/dL — ABNORMAL LOW (ref 3.5–5.0)
Alkaline Phosphatase: 76 U/L (ref 38–126)
Anion gap: 8 (ref 5–15)
BUN: 5 mg/dL — ABNORMAL LOW (ref 6–20)
CO2: 26 mmol/L (ref 22–32)
Calcium: 8.4 mg/dL — ABNORMAL LOW (ref 8.9–10.3)
Chloride: 106 mmol/L (ref 98–111)
Creatinine, Ser: 0.81 mg/dL (ref 0.44–1.00)
GFR calc Af Amer: >60 mL/min (ref >60)
GFR calc non Af Amer: >60 mL/min (ref >60)
Glucose, Bld: 101 mg/dL — ABNORMAL HIGH (ref 70–99)
Potassium: 3.8 mmol/L (ref 3.5–5.1)
Sodium: 140 mmol/L (ref 135–145)
Total Bilirubin: 0.5 mg/dL (ref 0.3–1.2)
Total Protein: 5.6 g/dL — ABNORMAL LOW (ref 6.5–8.1)

## 2020-05-23 LAB — LACTIC ACID, PLASMA: Lactic Acid, Venous: 1.2 mmol/L (ref 0.5–1.9)

## 2020-05-23 LAB — GLUCOSE, CAPILLARY
Glucose-Capillary: 79 mg/dL (ref 70–99)
Glucose-Capillary: 117 mg/dL — ABNORMAL HIGH (ref 70–99)
Glucose-Capillary: 157 mg/dL — ABNORMAL HIGH (ref 70–99)

## 2020-05-23 LAB — CBC
HCT: 33.7 % — ABNORMAL LOW (ref 36.0–46.0)
Hemoglobin: 10.8 g/dL — ABNORMAL LOW (ref 12.0–15.0)
MCH: 28 pg (ref 26.0–34.0)
MCHC: 32 g/dL (ref 30.0–36.0)
MCV: 87.3 fL (ref 80.0–100.0)
Platelets: 227 10*3/uL (ref 150–400)
RBC: 3.86 MIL/uL — ABNORMAL LOW (ref 3.87–5.11)
RDW: 14 % (ref 11.5–15.5)
WBC: 8.7 10*3/uL (ref 4.0–10.5)
nRBC: 0 % (ref 0.0–0.2)

## 2020-05-23 LAB — TRIGLYCERIDES: Triglycerides: 194 mg/dL — ABNORMAL HIGH (ref <150)

## 2020-05-23 LAB — MRSA PCR SCREENING: MRSA by PCR: NEGATIVE

## 2020-05-23 SURGERY — DENTAL RESTORATION/EXTRACTIONS
Anesthesia: General | Site: Neck | Laterality: Right

## 2020-05-23 MED ORDER — PROPOFOL 1000 MG/100ML IV EMUL
5.0000 ug/kg/min | INTRAVENOUS | Status: DC
  Administered 2020-05-23: 70 ug/kg/min via INTRAVENOUS
  Administered 2020-05-23: 80 ug/kg/min via INTRAVENOUS
  Administered 2020-05-24 (×2): 60 ug/kg/min via INTRAVENOUS
  Administered 2020-05-24: 50 ug/kg/min via INTRAVENOUS
  Administered 2020-05-24: 40 ug/kg/min via INTRAVENOUS
  Filled 2020-05-23 (×7): qty 100

## 2020-05-23 MED ORDER — FENTANYL CITRATE (PF) 100 MCG/2ML IJ SOLN: 25.0000 ug | INTRAMUSCULAR | Status: DC | PRN

## 2020-05-23 MED ORDER — DOCUSATE SODIUM 50 MG/5ML PO LIQD: 100.0000 mg | Freq: Two times a day (BID) | ORAL | Status: DC

## 2020-05-23 MED ORDER — KETAMINE HCL 50 MG/5ML IJ SOSY
PREFILLED_SYRINGE | INTRAMUSCULAR | Status: AC
  Filled 2020-05-23: qty 5

## 2020-05-23 MED ORDER — CHLORHEXIDINE GLUCONATE CLOTH 2 % EX PADS
6.0000 | MEDICATED_PAD | Freq: Every day | CUTANEOUS | Status: DC
  Administered 2020-05-23 – 2020-05-26 (×4): 6 via TOPICAL

## 2020-05-23 MED ORDER — CHLORHEXIDINE GLUCONATE 0.12% ORAL RINSE (MEDLINE KIT)
15.0000 mL | Freq: Two times a day (BID) | OROMUCOSAL | Status: DC
  Administered 2020-05-23 – 2020-05-24 (×2): 15 mL via OROMUCOSAL

## 2020-05-23 MED ORDER — PANTOPRAZOLE SODIUM 40 MG IV SOLR
40.0000 mg | Freq: Every day | INTRAVENOUS | Status: DC
  Administered 2020-05-23 – 2020-05-25 (×3): 40 mg via INTRAVENOUS
  Filled 2020-05-23 (×3): qty 40

## 2020-05-23 MED ORDER — ESMOLOL HCL 100 MG/10ML IV SOLN
INTRAVENOUS | Status: AC
  Filled 2020-05-23: qty 10

## 2020-05-23 MED ORDER — FENTANYL 2500MCG IN NS 250ML (10MCG/ML) PREMIX INFUSION
0.0000 ug/h | INTRAVENOUS | Status: DC
  Administered 2020-05-23: 25 ug/h via INTRAVENOUS
  Filled 2020-05-23 (×2): qty 250

## 2020-05-23 MED ORDER — FENTANYL CITRATE (PF) 100 MCG/2ML IJ SOLN
INTRAMUSCULAR | Status: DC | PRN
  Administered 2020-05-23 (×2): 50 ug via INTRAVENOUS

## 2020-05-23 MED ORDER — DEXTROSE-NACL 5-0.45 % IV SOLN: INTRAVENOUS | Status: AC

## 2020-05-23 MED ORDER — OXYMETAZOLINE HCL 0.05 % NA SOLN
NASAL | Status: AC
  Filled 2020-05-23: qty 30

## 2020-05-23 MED ORDER — MIDAZOLAM HCL 2 MG/2ML IJ SOLN
INTRAMUSCULAR | Status: AC
  Filled 2020-05-23: qty 2

## 2020-05-23 MED ORDER — PROPOFOL 10 MG/ML IV BOLUS
INTRAVENOUS | Status: DC | PRN
  Administered 2020-05-23 (×4): 20 mg via INTRAVENOUS
  Administered 2020-05-23: 30 mg via INTRAVENOUS

## 2020-05-23 MED ORDER — KETAMINE HCL 10 MG/ML IJ SOLN
INTRAMUSCULAR | Status: DC | PRN
Start: 2020-05-23 — End: 2020-05-23
  Administered 2020-05-23 (×3): 10 mg via INTRAVENOUS

## 2020-05-23 MED ORDER — ONDANSETRON HCL 4 MG/2ML IJ SOLN
INTRAMUSCULAR | Status: AC
  Filled 2020-05-23: qty 2

## 2020-05-23 MED ORDER — LIDOCAINE-EPINEPHRINE 1 %-1:100000 IJ SOLN
INTRAMUSCULAR | Status: AC
  Filled 2020-05-23: qty 1

## 2020-05-23 MED ORDER — LIDOCAINE 2% (20 MG/ML) 5 ML SYRINGE
INTRAMUSCULAR | Status: AC
  Filled 2020-05-23: qty 5

## 2020-05-23 MED ORDER — OXYMETAZOLINE HCL 0.05 % NA SOLN
NASAL | Status: DC | PRN
  Administered 2020-05-23: 1 via TOPICAL

## 2020-05-23 MED ORDER — DEXMEDETOMIDINE (PRECEDEX) IN NS 20 MCG/5ML (4 MCG/ML) IV SYRINGE
PREFILLED_SYRINGE | INTRAVENOUS | Status: DC | PRN
  Administered 2020-05-23: 8 ug via INTRAVENOUS
  Administered 2020-05-23: 4 ug via INTRAVENOUS
  Administered 2020-05-23: 8 ug via INTRAVENOUS

## 2020-05-23 MED ORDER — HYDROMORPHONE HCL 1 MG/ML IJ SOLN: 0.2500 mg | INTRAMUSCULAR | Status: DC | PRN

## 2020-05-23 MED ORDER — LIDOCAINE-EPINEPHRINE 2 %-1:100000 IJ SOLN
INTRAMUSCULAR | Status: AC
  Filled 2020-05-23: qty 1

## 2020-05-23 MED ORDER — MIDAZOLAM HCL 2 MG/2ML IJ SOLN: 2.0000 mg | INTRAMUSCULAR | Status: DC | PRN

## 2020-05-23 MED ORDER — SODIUM CHLORIDE 0.9 % IR SOLN
Status: DC | PRN
  Administered 2020-05-23: 1000 mL

## 2020-05-23 MED ORDER — DEXAMETHASONE SODIUM PHOSPHATE 10 MG/ML IJ SOLN
INTRAMUSCULAR | Status: AC
  Filled 2020-05-23: qty 1

## 2020-05-23 MED ORDER — DEXAMETHASONE SODIUM PHOSPHATE 4 MG/ML IJ SOLN
4.0000 mg | Freq: Four times a day (QID) | INTRAMUSCULAR | Status: AC
  Administered 2020-05-23 – 2020-05-24 (×4): 4 mg via INTRAVENOUS
  Filled 2020-05-23 (×4): qty 1

## 2020-05-23 MED ORDER — POLYETHYLENE GLYCOL 3350 17 G PO PACK
17.0000 g | PACK | Freq: Every day | ORAL | Status: DC
  Filled 2020-05-23: qty 1

## 2020-05-23 MED ORDER — ROCURONIUM BROMIDE 10 MG/ML (PF) SYRINGE
PREFILLED_SYRINGE | INTRAVENOUS | Status: AC
  Filled 2020-05-23: qty 10

## 2020-05-23 MED ORDER — FENTANYL CITRATE (PF) 250 MCG/5ML IJ SOLN
INTRAMUSCULAR | Status: AC
  Filled 2020-05-23: qty 5

## 2020-05-23 MED ORDER — DEXAMETHASONE SODIUM PHOSPHATE 10 MG/ML IJ SOLN
INTRAMUSCULAR | Status: DC | PRN
  Administered 2020-05-23: 10 mg via INTRAVENOUS

## 2020-05-23 MED ORDER — SODIUM CHLORIDE 0.9 % IV SOLN: 3.0000 g | INTRAVENOUS | Status: DC

## 2020-05-23 MED ORDER — ONDANSETRON HCL 4 MG/2ML IJ SOLN
INTRAMUSCULAR | Status: DC | PRN
  Administered 2020-05-23: 4 mg via INTRAVENOUS

## 2020-05-23 MED ORDER — CHLORHEXIDINE GLUCONATE CLOTH 2 % EX PADS: 6.0000 | MEDICATED_PAD | Freq: Once | CUTANEOUS | Status: DC

## 2020-05-23 MED ORDER — LACTATED RINGERS IV SOLN: INTRAVENOUS | Status: DC

## 2020-05-23 MED ORDER — LIDOCAINE-EPINEPHRINE 2 %-1:100000 IJ SOLN
INTRAMUSCULAR | Status: DC | PRN
  Administered 2020-05-23: 10 mL

## 2020-05-23 MED ORDER — PROMETHAZINE HCL 25 MG/ML IJ SOLN: 6.2500 mg | INTRAMUSCULAR | Status: DC | PRN

## 2020-05-23 MED ORDER — EPHEDRINE 5 MG/ML INJ
INTRAVENOUS | Status: AC
  Filled 2020-05-23: qty 10

## 2020-05-23 MED ORDER — PROPOFOL 10 MG/ML IV BOLUS
INTRAVENOUS | Status: AC
  Filled 2020-05-23: qty 20

## 2020-05-23 MED ORDER — ENSURE ENLIVE PO LIQD: 237.0000 mL | Freq: Three times a day (TID) | ORAL | Status: DC

## 2020-05-23 MED ORDER — 0.9 % SODIUM CHLORIDE (POUR BTL) OPTIME
TOPICAL | Status: DC | PRN
  Administered 2020-05-23: 1000 mL

## 2020-05-23 MED ORDER — DEXAMETHASONE SODIUM PHOSPHATE 4 MG/ML IJ SOLN: 4.0000 mg | Freq: Four times a day (QID) | INTRAMUSCULAR | Status: DC

## 2020-05-23 MED ORDER — OXYCODONE HCL 5 MG PO TABS: 5.0000 mg | ORAL_TABLET | Freq: Once | ORAL | Status: DC | PRN

## 2020-05-23 MED ORDER — ORAL CARE MOUTH RINSE
15.0000 mL | OROMUCOSAL | Status: DC
  Administered 2020-05-23 – 2020-05-24 (×10): 15 mL via OROMUCOSAL

## 2020-05-23 MED ORDER — PROPOFOL 500 MG/50ML IV EMUL
INTRAVENOUS | Status: DC | PRN
  Administered 2020-05-23: 125 ug/kg/min via INTRAVENOUS

## 2020-05-23 MED ORDER — OXYCODONE HCL 5 MG/5ML PO SOLN: 5.0000 mg | Freq: Once | ORAL | Status: DC | PRN

## 2020-05-23 MED ORDER — ROCURONIUM BROMIDE 10 MG/ML (PF) SYRINGE
PREFILLED_SYRINGE | INTRAVENOUS | Status: DC | PRN
  Administered 2020-05-23: 50 mg via INTRAVENOUS

## 2020-05-23 MED ORDER — PHENYLEPHRINE 40 MCG/ML (10ML) SYRINGE FOR IV PUSH (FOR BLOOD PRESSURE SUPPORT)
PREFILLED_SYRINGE | INTRAVENOUS | Status: AC
  Filled 2020-05-23: qty 10

## 2020-05-23 MED ORDER — MEPERIDINE HCL 25 MG/ML IJ SOLN: 6.2500 mg | INTRAMUSCULAR | Status: DC | PRN

## 2020-05-23 MED ORDER — GLYCOPYRROLATE PF 0.2 MG/ML IJ SOSY
PREFILLED_SYRINGE | INTRAMUSCULAR | Status: DC | PRN
  Administered 2020-05-23: .2 mg via INTRAVENOUS

## 2020-05-23 MED ORDER — MIDAZOLAM HCL 5 MG/5ML IJ SOLN
INTRAMUSCULAR | Status: DC | PRN
  Administered 2020-05-23 (×2): 2 mg via INTRAVENOUS

## 2020-05-23 MED ORDER — ADULT MULTIVITAMIN W/MINERALS CH
1.0000 | ORAL_TABLET | Freq: Every day | ORAL | Status: DC
  Filled 2020-05-23: qty 1

## 2020-05-23 SURGICAL SUPPLY — 59 items
BLADE SURG 15 STRL LF DISP TIS (BLADE) ×2 IMPLANT
BLADE SURG 15 STRL SS (BLADE) ×2
BNDG CONFORM 2 STRL LF (GAUZE/BANDAGES/DRESSINGS) ×4 IMPLANT
BUR CROSS CUT FISSURE 1.6 (BURR) ×3 IMPLANT
BUR CROSS CUT FISSURE 1.6MM (BURR) ×1
BUR EGG ELITE 4.0 (BURR) ×3 IMPLANT
BUR EGG ELITE 4.0MM (BURR) ×1
CANISTER SUCT 3000ML PPV (MISCELLANEOUS) ×4 IMPLANT
COVER SURGICAL LIGHT HANDLE (MISCELLANEOUS) ×8 IMPLANT
COVER WAND RF STERILE (DRAPES) ×4 IMPLANT
DECANTER SPIKE VIAL GLASS SM (MISCELLANEOUS) ×4 IMPLANT
DRAIN PENROSE 1/4X12 LTX STRL (WOUND CARE) ×4 IMPLANT
DRAPE U-SHAPE 76X120 STRL (DRAPES) ×4 IMPLANT
DRSG PAD ABDOMINAL 8X10 ST (GAUZE/BANDAGES/DRESSINGS) IMPLANT
ELECT COATED BLADE 2.86 ST (ELECTRODE) ×4 IMPLANT
ELECT REM PT RETURN 9FT ADLT (ELECTROSURGICAL) ×4
ELECTRODE REM PT RTRN 9FT ADLT (ELECTROSURGICAL) ×2 IMPLANT
GAUZE 4X4 16PLY RFD (DISPOSABLE) ×4 IMPLANT
GAUZE PACKING FOLDED 2  STR (GAUZE/BANDAGES/DRESSINGS) ×2
GAUZE PACKING FOLDED 2 STR (GAUZE/BANDAGES/DRESSINGS) ×2 IMPLANT
GAUZE SPONGE 4X4 12PLY STRL (GAUZE/BANDAGES/DRESSINGS) ×8 IMPLANT
GLOVE BIO SURGEON STRL SZ 6.5 (GLOVE) IMPLANT
GLOVE BIO SURGEON STRL SZ7 (GLOVE) IMPLANT
GLOVE BIO SURGEON STRL SZ8 (GLOVE) ×4 IMPLANT
GLOVE BIO SURGEONS STRL SZ 6.5 (GLOVE)
GLOVE BIOGEL PI IND STRL 6.5 (GLOVE) IMPLANT
GLOVE BIOGEL PI IND STRL 7.0 (GLOVE) IMPLANT
GLOVE BIOGEL PI INDICATOR 6.5 (GLOVE)
GLOVE BIOGEL PI INDICATOR 7.0 (GLOVE)
GOWN STRL REUS W/ TWL LRG LVL3 (GOWN DISPOSABLE) ×2 IMPLANT
GOWN STRL REUS W/ TWL XL LVL3 (GOWN DISPOSABLE) ×2 IMPLANT
GOWN STRL REUS W/TWL LRG LVL3 (GOWN DISPOSABLE) ×2
GOWN STRL REUS W/TWL XL LVL3 (GOWN DISPOSABLE) ×2
IV NS 1000ML (IV SOLUTION) ×2
IV NS 1000ML BAXH (IV SOLUTION) ×2 IMPLANT
KIT BASIN OR (CUSTOM PROCEDURE TRAY) ×4 IMPLANT
KIT TURNOVER KIT B (KITS) ×4 IMPLANT
MARKER SKIN DUAL TIP RULER LAB (MISCELLANEOUS) ×4 IMPLANT
NEEDLE HYPO 25GX1X1/2 BEV (NEEDLE) ×8 IMPLANT
NS IRRIG 1000ML POUR BTL (IV SOLUTION) ×4 IMPLANT
PACK SURGICAL SETUP 50X90 (CUSTOM PROCEDURE TRAY) ×4 IMPLANT
PAD ARMBOARD 7.5X6 YLW CONV (MISCELLANEOUS) ×8 IMPLANT
PENCIL BUTTON HOLSTER BLD 10FT (ELECTRODE) ×4 IMPLANT
POSITIONER HEAD DONUT 9IN (MISCELLANEOUS) IMPLANT
SLEEVE IRRIGATION ELITE 7 (MISCELLANEOUS) ×4 IMPLANT
SPONGE SURGIFOAM ABS GEL 12-7 (HEMOSTASIS) IMPLANT
SUT CHROMIC 3 0 PS 2 (SUTURE) ×4 IMPLANT
SUT ETHILON 2 0 FS 18 (SUTURE) ×4 IMPLANT
SUT ETHILON 3 0 PS 1 (SUTURE) ×4 IMPLANT
SWAB COLLECTION DEVICE MRSA (MISCELLANEOUS) ×4 IMPLANT
SWAB CULTURE ESWAB REG 1ML (MISCELLANEOUS) ×4 IMPLANT
SYR BULB IRRIG 60ML STRL (SYRINGE) ×4 IMPLANT
SYR CONTROL 10ML LL (SYRINGE) ×4 IMPLANT
TAPE PAPER 2X10 WHT MICROPORE (GAUZE/BANDAGES/DRESSINGS) ×4 IMPLANT
TRAY ENT MC OR (CUSTOM PROCEDURE TRAY) ×4 IMPLANT
TUBE CONNECTING 12'X1/4 (SUCTIONS) ×1
TUBE CONNECTING 12X1/4 (SUCTIONS) ×3 IMPLANT
TUBING IRRIGATION (MISCELLANEOUS) ×4 IMPLANT
YANKAUER SUCT BULB TIP NO VENT (SUCTIONS) ×4 IMPLANT

## 2020-05-23 NOTE — Transfer of Care (Signed)
Immediate Anesthesia Transfer of Care Note  Patient: Holly Terrell  Procedure(s) Performed: DENTAL RESTORATION/EXTRACTIONS (N/A Mouth) INCISION AND DRAINAGE ABSCESS RIGHT NECK (Right Neck)  Patient Location: PACU  Anesthesia Type:General  Level of Consciousness: sedated and Patient remains intubated per anesthesia plan  Airway & Oxygen Therapy: Patient remains intubated per anesthesia plan and Patient placed on Ventilator (see vital sign flow sheet for setting)  Post-op Assessment: Report given to RN and Post -op Vital signs reviewed and stable  Post vital signs: Reviewed and stable  Last Vitals:  Vitals Value Taken Time  BP 119/60 05/23/20 1649  Temp    Pulse 110 05/23/20 1656  Resp 15 05/23/20 1656  SpO2 100 % 05/23/20 1656  Vitals shown include unvalidated device data.  Last Pain:  Vitals:   05/23/20 1407  TempSrc: Oral  PainSc:       Patients Stated Pain Goal: 0 (05/23/20 0223)  Complications: No complications documented.

## 2020-05-23 NOTE — Op Note (Signed)
05/21/2020 - 05/23/2020  4:36 PM  PATIENT:  Holly Terrell  39 y.o. female  PRE-OPERATIVE DIAGNOSIS:  Right submandibular abscess, Non-restorable teeth secondary to dental caries  POST-OPERATIVE DIAGNOSIS:  SAME+ Non-restorable teeth #30, 31  PROCEDURE:  Procedure(s): EXTRACTION Teeth #30, 31 INCISION AND DRAINAGE Right Submandibular space infection  SURGEON:  Surgeon(s): Ocie Doyne, DDS  ANESTHESIA:   local and general  EBL:  minimal  DRAINS: 1/4" penrose right neck  SPECIMEN:  No Specimen  COUNTS:  YES  PLAN OF CARE: Discharge to home after PACU  PATIENT DISPOSITION:  ICU for overnight intubation   PROCEDURE DETAILS: Dictation # [id number] Holly Terrell, DMD 05/23/2020 4:36 PM

## 2020-05-23 NOTE — Progress Notes (Signed)
Initial Nutrition Assessment  DOCUMENTATION CODES:   Morbid obesity  INTERVENTION:   -Once diet is advanced, add:  -Ensure Enlive po TID, each supplement provides 350 kcal and 20 grams of protein -MVI with minerals daily  NUTRITION DIAGNOSIS:   Increased nutrient needs related to post-op healing as evidenced by estimated needs.  GOAL:   Patient will meet greater than or equal to 90% of their needs  MONITOR:   PO intake, Supplement acceptance, Diet advancement, Labs, Weight trends, Skin, I & O's  REASON FOR ASSESSMENT:   Malnutrition Screening Tool    ASSESSMENT:   Holly Terrell is a 39 yr old female with a PMHx of depression. She presents with severe right neck pain, erythema, and swelling.  Pt admitted with submandibular abscess.   Reviewed I/O's: +1.2 L x 24 hours and +2.2 L since admission  Pt resting quietly at time of visit.   Pt was initially on full liquid diet; noted meal completion 25-50%. Pt is NPO for possible surgery today; awaiting maxillofacial surgery consult. Per MD, she is unable to open her mouth secondary to swelling.   Reviewed wt hx; no wt loss noted.   Medications reviewed and include dextrose 5%-0.45% sodium chloride infusion @ 100 ml/hr.   Labs reviewed: CBGS: 136.   NUTRITION - FOCUSED PHYSICAL EXAM:    Most Recent Value  Orbital Region No depletion  Upper Arm Region No depletion  Thoracic and Lumbar Region No depletion  Buccal Region No depletion  Temple Region No depletion  Clavicle Bone Region No depletion  Clavicle and Acromion Bone Region No depletion  Scapular Bone Region No depletion  Dorsal Hand No depletion  Patellar Region No depletion  Anterior Thigh Region No depletion  Posterior Calf Region No depletion  Edema (RD Assessment) None  Hair Reviewed  Eyes Reviewed  Mouth Reviewed  Skin Reviewed  Nails Reviewed       Diet Order:   Diet Order            Diet NPO time specified  Diet effective midnight            Diet NPO time specified  Diet effective now                 EDUCATION NEEDS:   No education needs have been identified at this time  Skin:  Skin Assessment: Reviewed RN Assessment  Last BM:  05/20/20  Height:   Ht Readings from Last 1 Encounters:  05/21/20 5' (1.524 m)    Weight:   Wt Readings from Last 1 Encounters:  05/21/20 94.8 kg    Ideal Body Weight:  45.5 kg  BMI:  Body mass index is 40.82 kg/m.  Estimated Nutritional Needs:   Kcal:  1600-1800  Protein:  90-105 grams  Fluid:  > 1.6 L    Levada Schilling, RD, LDN, CDCES Registered Dietitian II Certified Diabetes Care and Education Specialist Please refer to [facility] for RD and/or RD on-call/weekend/after hours pager

## 2020-05-23 NOTE — Anesthesia Preprocedure Evaluation (Addendum)
Anesthesia Evaluation  Patient identified by MRN, date of birth, ID band Patient awake    Reviewed: Allergy & Precautions, NPO status , Patient's Chart, lab work & pertinent test results  Airway Mallampati: IV  TM Distance: <3 FB Neck ROM: Limited  Mouth opening: Limited Mouth Opening  Dental   Unable to assess due to limited mouth opening :   Pulmonary neg pulmonary ROS,    Pulmonary exam normal breath sounds clear to auscultation       Cardiovascular hypertension, Normal cardiovascular exam Rhythm:Regular Rate:Normal     Neuro/Psych negative neurological ROS  negative psych ROS   GI/Hepatic negative GI ROS, Neg liver ROS,   Endo/Other  Morbid obesityBMI 41  Renal/GU negative Renal ROS  negative genitourinary   Musculoskeletal negative musculoskeletal ROS (+)   Abdominal (+) + obese,   Peds  Hematology  (+) Blood dyscrasia, anemia , hct 33.7   Anesthesia Other Findings Right submandibular abscess- unable to open mouth, limited by pain   Reproductive/Obstetrics negative OB ROS                           Anesthesia Physical Anesthesia Plan  ASA: III  Anesthesia Plan: General   Post-op Pain Management:    Induction: Intravenous  PONV Risk Score and Plan: 3 and Ondansetron, Dexamethasone, Treatment may vary due to age or medical condition and Midazolam  Airway Management Planned: Nasal ETT, Video Laryngoscope Planned, Fiberoptic Intubation Planned, Awake Intubation Planned and Oral ETT  Additional Equipment: None  Intra-op Plan:   Post-operative Plan: Extubation in OR  Informed Consent: I have reviewed the patients History and Physical, chart, labs and discussed the procedure including the risks, benefits and alternatives for the proposed anesthesia with the patient or authorized representative who has indicated his/her understanding and acceptance.     Dental advisory  given  Plan Discussed with:   Anesthesia Plan Comments: (Extremely limited mouth opening secondary to submandibular pain and swelling. Also unable to remove tongue piercing- per pt, unable to protrude tongue enough to get piercing out. D/w patient that she is at increased risk of trauma to the lips, gums and teeth with this piercing in place. D/w patient awake fiberoptic intubation.)      Anesthesia Quick Evaluation

## 2020-05-23 NOTE — Progress Notes (Signed)
Reported that patient have a tongue ring, unable to remove due to unable to open mouth seconday to abscess.

## 2020-05-23 NOTE — Progress Notes (Signed)
Transported pt from PACU to 3M10. Pt tolerated well. Pt airway was suctioned before transport.

## 2020-05-23 NOTE — Anesthesia Postprocedure Evaluation (Signed)
Anesthesia Post Note  Patient: Human resources officer  Procedure(s) Performed: DENTAL RESTORATION/EXTRACTIONS (N/A Mouth) INCISION AND DRAINAGE ABSCESS RIGHT NECK (Right Neck)     Patient location during evaluation: PACU Anesthesia Type: General Level of consciousness: sedated and patient remains intubated per anesthesia plan Pain management: pain level controlled Vital Signs Assessment: post-procedure vital signs reviewed and stable Respiratory status: spontaneous breathing, nonlabored ventilation and respiratory function stable Cardiovascular status: blood pressure returned to baseline and stable Postop Assessment: no apparent nausea or vomiting Anesthetic complications: no Comments: Patient intubated and sedated, awaiting ICU bed. Very difficult airway, decision to leave patient intubated nasally overnight to allow for decreased airway edema before trial of extubation. Full report to ICU provider on pager.    No complications documented.  Last Vitals:  Vitals:   05/23/20 1649 05/23/20 1650  BP: 119/60 119/60  Pulse: (!) 115 (!) 111  Resp: 15 11  Temp: (!) 36.3 C   SpO2: 100% 100%    Last Pain:  Vitals:   05/23/20 1407  TempSrc: Oral  PainSc:                  Lannie Fields

## 2020-05-23 NOTE — Progress Notes (Signed)
° °  Subjective: Interviewed patient at bedside.  She states she still can't open mouth much because she's still in significant pain. She reports continuing to have no sensation of choking, and that her breathing okay. States she has posterior and Right lateral neck pain. Reports she doesn't feel any better after antibiotics. States this is her first ever abscess.  She says she's up walking around. Discussed that team will call Maxillofacial surgery and will update patient and family when we know. Due to chance surgery could happen today she will be kept NPO, we will start IVF in the meantime.   Objective:  Vital signs in last 24 hours: Vitals:   05/21/20 2112 05/22/20 0118 05/22/20 2048 05/23/20 0417  BP: 123/87 103/60 (!) 111/59 (!) 103/53  Pulse: (!) 103 82 68 61  Resp: 16 18 18 15   Temp: 98.4 F (36.9 C) 98.4 F (36.9 C) 98 F (36.7 C) 97.6 F (36.4 C)  TempSrc: Oral Oral Oral Oral  SpO2: 97% 96% 100% 98%  Weight:      Height:       Physical Exam Vitals and nursing note reviewed.  Constitutional:      General: She is not in acute distress.    Appearance: Normal appearance. She is not ill-appearing or toxic-appearing.  Neck:      Comments: Very tender to palpitation, swollen and erythematous.  Cardiovascular:     Rate and Rhythm: Normal rate and regular rhythm.     Pulses: Normal pulses.          Radial pulses are 2+ on the right side and 2+ on the left side.       Dorsalis pedis pulses are 2+ on the right side and 2+ on the left side.     Heart sounds: Normal heart sounds, S1 normal and S2 normal. No murmur heard.   Pulmonary:     Effort: Pulmonary effort is normal. No respiratory distress.     Breath sounds: Normal breath sounds. No wheezing.  Musculoskeletal:     Right lower leg: No edema.     Left lower leg: No edema.  Neurological:     Mental Status: She is alert.      Assessment/Plan:  Active Problems:   Submandibular abscess   Submandibular  Abscess: Reports minor improvement overnight since starting the IV antibiotics, will continue with Unasyn. Changed pain medication yesterday for better pain control. Consulted Maxillofacial surgery and they will take her to the OR at 3 PM. Continue to monitor for airway compromise. Have made NPO. -Surgical drainage at 3 PM -Appreciate Maxillofacial recommendations -Continue Unasyn 3 g q6 -Start Tylenol (160mg /24ml) 325 mg q6 -Start Oxycodone (5mg /56ml) 5 mg q6 PRN breakthrough pain -NPO   Prior to Admission Living Arrangement: Home Anticipated Discharge Location: Home Barriers to Discharge: Surgical drainage of abscess Dispo: Anticipated discharge in approximately 1-2 day(s).   4m, MD 05/23/2020, 10:45 AM Pager: [phone] After 5pm on weekdays and 1pm on weekends: On Call pager [phone]

## 2020-05-23 NOTE — Consult Note (Signed)
Reason for Consult: Right jaw swelling Referring Physician:   Alene Terrell is an 39 y.o. female.  CC: Right jaw pain with swelling. Cant open mouth   HPI: Pain and swelling began about 3 weeks ago. Gradually got worse. Took Augmentin po for 1 week without improvement. To ER 05/21/2020 unable to open mouth. Admitted for IV antibiotics.  Past Medical History:  Diagnosis Date  . Medical history non-contributory   . No pertinent past medical history   . Pregnancy induced hypertension   . Urinary tract infection     Past Surgical History:  Procedure Laterality Date  . CESAREAN SECTION    . CESAREAN SECTION N/A 02/16/2013   Procedure: Repeat CESAREAN SECTION of baby boy at 0221 APGAR 7/9;  Surgeon: Tilda Burrow, MD;  Location: [facility];  Service: Obstetrics;  Laterality: N/A;  . TONSILLECTOMY      Family History  Problem Relation Age of Onset  . Hypertension Mother   . Birth defects Son     Social History:  reports that she has never smoked. She has never used smokeless tobacco. She reports that she does not drink alcohol and does not use drugs.  Allergies: No Known Allergies  Medications: I have reviewed the patient's current medications.  Results for orders placed or performed during the hospital encounter of 05/21/20 (from the past 48 hour(s))  HIV Antibody (routine testing w rflx)     Status: None   Collection Time: 05/21/20  5:36 PM  Result Value Ref Range   HIV Screen 4th Generation wRfx Non Reactive Non Reactive    Comment: Performed at [facility], [address] [place], [place] [address]  CBC     Status: None   Collection Time: 05/22/20  1:51 AM  Result Value Ref Range   WBC 9.9 4.0 - 10.5 K/uL   RBC 4.33 3.87 - 5.11 MIL/uL   Hemoglobin 12.1 12.0 - 15.0 g/dL   HCT 70.6 36 - 46 %   MCV 85.5 80.0 - 100.0 fL   MCH 27.9 26.0 - 34.0 pg   MCHC 32.7 30.0 - 36.0 g/dL   RDW 23.7 62.8 - 31.5 %   Platelets 258 150 - 400 K/uL   nRBC 0.0 0.0 - 0.2 %    Comment:  Performed at [facility], [address] [place], [place] [address]  Basic metabolic panel     Status: Abnormal   Collection Time: 05/22/20  1:51 AM  Result Value Ref Range   Sodium 137 135 - 145 mmol/L   Potassium 4.1 3.5 - 5.1 mmol/L   Chloride 104 98 - 111 mmol/L   CO2 22 22 - 32 mmol/L   Glucose, Bld 123 (H) 70 - 99 mg/dL    Comment: Glucose reference range applies only to samples taken after fasting for at least 8 hours.   BUN 5 (L) 6 - 20 mg/dL   Creatinine, Ser 0.73 0.44 - 1.00 mg/dL   Calcium 8.9 8.9 - 71.0 mg/dL   GFR calc non Af Amer >60 >60 mL/min   GFR calc Af Amer >60 >60 mL/min   Anion gap 11 5 - 15    Comment: Performed at [facility], [address] [place], [place] [address]  Glucose, capillary     Status: Abnormal   Collection Time: 05/22/20  8:34 AM  Result Value Ref Range   Glucose-Capillary 136 (H) 70 - 99 mg/dL    Comment: Glucose reference range applies  only to samples taken after fasting for at least 8 hours.  CBC     Status: Abnormal   Collection Time: 05/23/20  1:13 AM  Result Value Ref Range   WBC 8.7 4.0 - 10.5 K/uL   RBC 3.86 (L) 3.87 - 5.11 MIL/uL   Hemoglobin 10.8 (L) 12.0 - 15.0 g/dL   HCT 83.6 (L) 36 - 46 %   MCV 87.3 80.0 - 100.0 fL   MCH 28.0 26.0 - 34.0 pg   MCHC 32.0 30.0 - 36.0 g/dL   RDW 62.9 47.6 - 54.6 %   Platelets 227 150 - 400 K/uL   nRBC 0.0 0.0 - 0.2 %    Comment: Performed at [facility], [address] [place], [place] [address]  Comprehensive metabolic panel     Status: Abnormal   Collection Time: 05/23/20  1:13 AM  Result Value Ref Range   Sodium 140 135 - 145 mmol/L   Potassium 3.8 3.5 - 5.1 mmol/L   Chloride 106 98 - 111 mmol/L   CO2 26 22 - 32 mmol/L   Glucose, Bld 101 (H) 70 - 99 mg/dL    Comment: Glucose reference range applies only to samples taken after fasting for at least 8 hours.   BUN 5 (L) 6 - 20 mg/dL   Creatinine, Ser 6.56 0.44 - 1.00 mg/dL   Calcium 8.4 (L) 8.9 - 10.3 mg/dL   Total  Protein 5.6 (L) 6.5 - 8.1 g/dL   Albumin 2.9 (L) 3.5 - 5.0 g/dL   AST 42 (H) 15 - 41 U/L   ALT 71 (H) 0 - 44 U/L   Alkaline Phosphatase 76 38 - 126 U/L   Total Bilirubin 0.5 0.3 - 1.2 mg/dL   GFR calc non Af Amer >60 >60 mL/min   GFR calc Af Amer >60 >60 mL/min   Anion gap 8 5 - 15    Comment: Performed at [facility], [address], [place], [place] [address]  Lactic acid, plasma     Status: None   Collection Time: 05/23/20  1:13 AM  Result Value Ref Range   Lactic Acid, Venous 1.2 0.5 - 1.9 mmol/L    Comment: Performed at [facility], [address], [place], [place] [address]  Glucose, capillary     Status: None   Collection Time: 05/23/20  7:38 AM  Result Value Ref Range   Glucose-Capillary 79 70 - 99 mg/dL    Comment: Glucose reference range applies only to samples taken after fasting for at least 8 hours.    No results found.  ROS Blood pressure 119/66, pulse 86, temperature 98.4 F (36.9 C), temperature source Oral, resp. rate 18, height 5' (1.524 m), weight 94.8 kg, last menstrual period 05/07/2020, SpO2 100 %. General appearance: alert, cooperative, mild distress and morbidly obese Head: Normocephalic, without obvious abnormality, atraumatic Eyes: negative Nose: Nares normal. Septum midline. Mucosa normal. No drainage or sinus tenderness. Throat: Maximum opening 2-3 mm. Unable to visualize dentition. Neck: Right submandibular induration, erythema, and tenderness.   Assessment/Plan: Right submandibular infection from abscessed right molars. Plan Dental extractions, Incision and drainage. GA.  Ocie Doyne 05/23/2020, 3:08 PM

## 2020-05-23 NOTE — Op Note (Signed)
NAME: Holly Terrell, Holly Terrell MEDICAL RECORD NO:[id number] ACCOUNT NO.:[id number] DATE OF BIRTH:10/13/1981 FACILITY: [facility] LOCATION: [facility] PHYSICIAN:Tyrone Balash M. Jarius Dieudonne, DDS  OPERATIVE REPORT  DATE OF PROCEDURE:  05/23/2020  PREOPERATIVE DIAGNOSES:   1.  Right submandibular space abscess,  2.  Nonrestorable teeth secondary to dental caries.  POSTOPERATIVE DIAGNOSES:   1.  Right submandibular space abscess. 2.  Nonrestorable teeth secondary to dental caries.   3.  Nonrestorable teeth numbers 30 and 31.  PROCEDURES:   1.  Incision and drainage, right submandibular space infection.   2.  Extraction teeth of 30 and 31.  SURGEON:  Danniella Robben, DDS  ANESTHESIA:  General, oral intubation.  DESCRIPTION OF PROCEDURE:  The patient was placed on the operating table in the supine position.  Sedation was administered and attempts were made to nasally intubate with the scope.  These were unsuccessful.  Attempt was then made using a GlideScope  with a fresh nasal tube and this was used for successful intubation.  The tube was secured, the eyes were protected.  The patient was prepped and draped for surgery.  A timeout was performed.  A marking pen was used to demarcate the area of greatest  fluctuance, which was in the tissue approximately 3 cm below the inferior border of the mandible in the premolar region.  Local anesthesia 2% lidocaine 1:100,000 epinephrine was infiltrated in this area and then a 15 blade was used to make an incision  through skin and subcutaneous tissue.  Then, hemostats were inserted and blunt dissection was carried forward towards the mandibular inferior border in the molar region.  There was gross purulent material, approximately 10 mL that was evacuated.   Cultures were taken for aerobic and anaerobic, as well as Gram stain.  Then, the hemostats were taken all the way to the inferior border of the mandible and to the lingual aspect of it.   Loculations were opened with a hemostat and then the  area was  irrigated out with normal saline.  Attention was turned to the oral cavity.  The bite block was placed in the mouth and a throat pack was placed in the pharynx.  Lidocaine 2% with 1:100,000 epinephrine was infiltrated in an inferior alveolar block and  then local infiltration around the teeth to be removed.  The teeth were examined and found to have large caries, numbers 30 and 31.  The teeth were then elevated using a 301 elevator after reflecting the periosteum with a periosteal elevator.  Then,  dental forceps was used to remove the teeth.  The sockets were curetted and then irrigated.  Then, a quarter-inch Penrose drain was placed into the neck incision.  Palpation was used to determine that the drain did in fact go to the lingual aspect of the  mandible in the area of the molars.  Then, the drain was sutured to the skin with 3-0 nylon.  The oral cavity was then irrigated and suctioned and the throat pack was removed.  The patient was left in care of Anesthesia for transport to ICU for  overnight intubation due to difficult airway and possible airway edema.  ESTIMATED BLOOD LOSS:  Minimal.  COMPLICATIONS:  Difficult airway.  DRAINS:  Quarter-inch Penrose, right neck.  VN/NUANCE  D:05/23/2020 T:05/23/2020 JOB:[id number]  

## 2020-05-23 NOTE — Discharge Summary (Addendum)
Name: Holly Terrell MRN: [id number] DOB: [birth year] 39 y.o. PCP: [facility]  Date of Admission: 05/21/2020  7:55 AM Date of Discharge: 05/27/2020 Attending Physician: Tyson Alias, *  Discharge Diagnosis: 1. Submandibular Abscess 2. Difficult intubation, airway edema  Discharge Medications: Allergies as of 05/27/2020   No Known Allergies      Medication List     STOP taking these medications    amoxicillin-clavulanate 875-125 MG tablet Commonly known as: AUGMENTIN   ibuprofen 200 MG tablet Commonly known as: ADVIL       TAKE these medications    cefdinir 300 MG capsule Commonly known as: OMNICEF Take 1 capsule (300 mg total) by mouth every 12 (twelve) hours.   HYDROcodone-acetaminophen 5-325 MG tablet Commonly known as: NORCO/VICODIN Take 1 tablet by mouth every 6 (six) hours as needed for moderate pain.               Discharge Care Instructions  (From admission, onward)           Start     Ordered   05/27/20 0000  If the dressing is still on your incision site when you go home, remove it on the third day after your surgery date. Remove dressing if it begins to fall off, or if it is dirty or damaged before the third day.        05/27/20 1445            Disposition and follow-up:   Holly Terrell was discharged from [facility] in Stable condition.  At the hospital follow up visit please address:  1.  Submandibular Abscess: Address any further dental needs. Establish care with a primary care provider.  2.  Labs / imaging needed at time of follow-up: BMP, CBC, Dental x-rays  3.  Pending labs/ test needing follow-up: none  Follow-up Appointments:  Follow-up Information     [facility]. Go to.   Specialty: Pediatrics Contact information: [address] [place] [address] [phone]         Ocie Doyne, DDS. Go to.   Specialty: Oral  Surgery Contact information: [address] [place] [address] [phone]                 Hospital Course by problem list:  1. Submandibular Abscess: Otherwise healthy young person admitted with a right-sided submandibular abscess associated with dental caries which had not responded to outpatient oral antibiotics.  She was treated empirically with IV Unasyn, maxillofacial surgery was consulted and performed surgical incision and drainage of the abscess as well as removal of molars 30 and 31.  This operation was complicated by difficulty with intubation due to airway edema and significant trismus.  She spent about 24 hours postoperatively intubated in the ICU.  She was medically treated with dexamethasone and was able to be extubated safely.  Surgical cultures grew Streptococcus constellatus, antibiotics were switched from Unasyn to a cephalosporin based on the susceptibilities.  She is going to complete a 7-day course with cefdinir after discharge.  By postoperative day 3 her trismus was improving and she was able to tolerate an oral diet.   Discharge Vitals:   BP 121/76 (BP Location: Left Arm)   Pulse 82   Temp 98.5 F (36.9 C) (Oral)   Resp 18   Ht 5' (1.524 m)   Wt 94.8 kg   LMP 05/07/2020   SpO2 100%   BMI 40.82 kg/m  Pertinent Labs, Studies, and Procedures:  CMP Latest Ref Rng & Units 05/27/2020 05/26/2020 05/25/2020  Glucose 70 - 99 mg/dL 409(W) 98 119(J)  BUN 6 - 20 mg/dL 6 5(L) 7  Creatinine 4.78 - 1.00 mg/dL 2.95 6.21 3.08  Sodium 135 - 145 mmol/L 140 140 142  Potassium 3.5 - 5.1 mmol/L 4.0 3.4(L) 3.8  Chloride 98 - 111 mmol/L 103 104 106  CO2 22 - 32 mmol/L 30 27 26   Calcium 8.9 - 10.3 mg/dL ) 8.1(L) 8.5(L)  Total Protein 6.5 - 8.1 g/dL - - -  Total Bilirubin 0.3 - 1.2 mg/dL - - -  Alkaline Phos 38 - 126 U/L - - -  AST 15 - 41 U/L - - -  ALT 0 - 44 U/L - - -   BMP Latest Ref Rng & Units 05/27/2020 05/26/2020 05/25/2020  Glucose 70 - 99 mg/dL 07/25/2020)  98 846(N)  BUN 6 - 20 mg/dL 6 5(L) 7  Creatinine 629(B - 1.00 mg/dL 2.84 1.32 4.40  Sodium 135 - 145 mmol/L 140 140 142  Potassium 3.5 - 5.1 mmol/L 4.0 3.4(L) 3.8  Chloride 98 - 111 mmol/L 103 104 106  CO2 22 - 32 mmol/L 30 27 26   Calcium 8.9 - 10.3 mg/dL 1.02) 8.1(L) 8.5(L)    CT Soft Tissue Neck W Contrast: 3.2 x 3.0 x 1.6 cm peripherally enhancing collection within the right submandibular space consistent with abscess. Surrounding cellulitis. Additionally, subtle edema is questioned within the right posterior floor of mouth suspicious for associated floor of mouth infection. The right posterior mandibular molar is carious and findings may be odontogenic in origin. 9 mm nonspecific cutaneous/subcutaneous lesion of the right cheek.   Dental Surgery 8/9: Extraction of teeth 30 and 31. Drainage of Right submandibular abscess and placement of Penrose drain.   Discharge Instructions: Discharge Instructions     Call MD for:   Complete by: As directed    Call MD for:   Complete by: As directed    Call MD for:  difficulty breathing, headache or visual disturbances   Complete by: As directed    Call MD for:  difficulty breathing, headache or visual disturbances   Complete by: As directed    Call MD for:  extreme fatigue   Complete by: As directed    Call MD for:  extreme fatigue   Complete by: As directed    Call MD for:  hives   Complete by: As directed    Call MD for:  hives   Complete by: As directed    Call MD for:  persistant dizziness or light-headedness   Complete by: As directed    Call MD for:  persistant dizziness or light-headedness   Complete by: As directed    Call MD for:  persistant nausea and vomiting   Complete by: As directed    Call MD for:  persistant nausea and vomiting   Complete by: As directed    Call MD for:  redness, tenderness, or signs of infection (pain, swelling, redness, odor or green/yellow discharge around incision site)   Complete by: As  directed    Call MD for:  redness, tenderness, or signs of infection (pain, swelling, redness, odor or green/yellow discharge around incision site)   Complete by: As directed    Call MD for:  severe uncontrolled pain   Complete by: As directed    Call MD for:  severe uncontrolled pain   Complete by: As directed    Call  MD for:  temperature >100.4   Complete by: As directed    Call MD for:  temperature >100.4   Complete by: As directed    Diet - low sodium heart healthy   Complete by: As directed    Discharge instructions   Complete by: As directed    Dear Holly Terrell, It was a pleasure taking care of you. You were admitted for a submandibular abscess. It was successfully drained in surgery. Please ensure you take the full course of your antibiotics. Please establish care with a primary care provider to ensure continued health. Thank you   Discharge instructions   Complete by: As directed    Dear Holly Terrell, It was a pleasure taking care of you. You were admitted for a submandibular abscess. You were taken to the OR and had 2 teeth extracted and the abscess drained. We have given you an antibiotic, please take one pill every morning and one pill every night. Please ensure you finish this course of antibiotics. We recommend that you find a primary care provider and try to see them within the next week.  Thank you very much   If the dressing is still on your incision site when you go home, remove it on the third day after your surgery date. Remove dressing if it begins to fall off, or if it is dirty or damaged before the third day.   Complete by: As directed    Increase activity slowly   Complete by: As directed    Increase activity slowly   Complete by: As directed        Signed: Lauro Franklin, MD 05/27/2020, 2:45 PM   Pager: [phone]

## 2020-05-23 NOTE — Progress Notes (Signed)
Patient unable to remove tongue piercing for surgery today. Anesthesia made aware, discussed plan with patient to remove piercing in the OR.

## 2020-05-23 NOTE — Progress Notes (Signed)
NAME:  Holly Terrell, MRN:  [id number], DOB:  1981-10-08, LOS: 2 ADMISSION DATE:  05/21/2020, CONSULTATION DATE:  05/23/20 REFERRING MD:  DR Oswaldo Done, CHIEF COMPLAINT:  Post operative resp failure  Brief History   39 yo with R jaw pain and swelling found to have a R submandibular infection from abscessed R molars. Taken to OR for extraction and I&D  History of present illness   39 yo with no pmh presented with >3 weeks of tooth pain and jaw pain. Previously seen and given augmentin which she reportedly completed. Presented today unable to open mouth 2/2 pain and swelling. Found to have R submandibular abscess 2/2 molars. She was started on iv abx on 8/7 when she was admitted on 8/9 oral surgery was consulted and on 8/9 she underwent teeth extraction, I&D with penrose drain in place. She has been left intubated at this time 2/2 edema (nasally intubated).   CCM has been asked to consult 2/2 intubation status. All history is obtained from chart review as pt is intubated and sedated at this time.   Past Medical History   Past Medical History:  Diagnosis Date  . Medical history non-contributory   . No pertinent past medical history   . Pregnancy induced hypertension   . Urinary tract infection     Significant Hospital Events   Admitted 8/6  Consults:  Oral surgery 8/9  Procedures:  8/9: R submandibular abscess I&D, teeth extraction and tongue rind removal  Significant Diagnostic Tests:  Ct soft tissue neck 8/7: 3.2 x 3.0 x 1.6 cm peripherally enhancing collection within the right submandibular space consistent with abscess. Surrounding cellulitis. Additionally, subtle edema is questioned within the right posterior floor of mouth suspicious for associated floor of mouth infection. The right posterior mandibular molar is carious and findings may be odontogenic in origin.  9 mm nonspecific cutaneous/subcutaneous lesion of the right cheek. Direct visualization recommended.   Micro Data:   8/7 sars2: neg 8/7 strep: neg 8/9 intraop cx: pending  Antimicrobials:  clinda 8/7 unasyn 8/7->  Interim history/subjective:  8/9: pt intubated and heavily sedated to protect nasally intubated status.   Objective   Blood pressure 127/73, pulse 86, temperature (!) 97.3 F (36.3 C), resp. rate 16, height 5' (1.524 m), weight 94.8 kg, last menstrual period 05/07/2020, SpO2 100 %.    Vent Mode: PRVC FiO2 (%):  [100 %] 100 % Set Rate:  [16 bmp] 16 bmp Vt Set:  [360 mL] 360 mL PEEP:  [5 cmH20] 5 cmH20 Plateau Pressure:  [20 cmH20] 20 cmH20   Intake/Output Summary (Last 24 hours) at 05/23/2020 1806 Last data filed at 05/23/2020 1651 Gross per 24 hour  Intake 2350 ml  Output 20 ml  Net 2330 ml   Filed Weights   05/21/20 0759 05/21/20 0805 05/23/20 1500  Weight: 94.8 kg 94.8 kg 94.8 kg    Examination: General: no acute distress, sedated on vent HEENT: NCAT, PERRLA, post surgical changes with dressing and drain in place on neck Lungs: CTA, no wheezes, rhonchi, rales Cardiovascular: RRR, no m/g/r Abdomen: obese soft, NT,ND, BS+ Extremities: spontaneously moves all 4 Skin: no rashes, warm and dry, multiple tattoos noted on clothed exam Neuro: moves all 4 extremities, not following commands but sedated on propofol GU: deferred  Resolved Hospital Problem list    Assessment & Plan:  Post operative resp insuff:  -titrate vent -when edema improved will move to extubate but reportedly very challenging airway -rass -2 tonight -sedation only with propofol at  this time, will add fentanyl infusion and prn versed to achieve rass goal  -cxr for tube placement pending -add 4 doses decadron for best chance to decrease edema  R submandibular abscess:  -s/p surgical intervention -cont to follow cx -cont abx at this time -appreciate oral surgery -cont pain control    Best practice:  Diet: npo Pain/Anxiety/Delirium protocol (if indicated): per protocol VAP protocol (if indicated):  per protocol DVT prophylaxis: per surgery GI prophylaxis: per protocol Glucose control: monitoring Mobility: bedrest Code Status: full Family Communication: per primary Disposition: ICU  Labs   CBC: Recent Labs  Lab 05/21/20 0807 05/22/20 0151 05/23/20 0113  WBC 8.3 9.9 8.7  NEUTROABS 5.4  --   --   HGB 12.2 12.1 10.8*  HCT 38.8 37.0 33.7*  MCV 86.8 85.5 87.3  PLT 240 258 227    Basic Metabolic Panel: Recent Labs  Lab 05/21/20 0807 05/22/20 0151 05/23/20 0113  NA 139 137 140  K 3.7 4.1 3.8  CL 103 104 106  CO2 26 22 26   GLUCOSE 126* 123* 101*  BUN <5* 5* 5*  CREATININE 0.84 0.67 0.81  CALCIUM 9.1 8.9 8.4*   GFR: Estimated Creatinine Clearance: 97.9 mL/min (by C-G formula based on SCr of 0.81 mg/dL). Recent Labs  Lab 05/21/20 0807 05/21/20 1148 05/22/20 0151 05/23/20 0113  WBC 8.3  --  9.9 8.7  LATICACIDVEN 2.0* 1.0  --  1.2    Liver Function Tests: Recent Labs  Lab 05/21/20 0807 05/23/20 0113  AST 44* 42*  ALT 54* 71*  ALKPHOS 90 76  BILITOT 0.7 0.5  PROT 7.0 5.6*  ALBUMIN 3.3* 2.9*   No results for input(s): LIPASE, AMYLASE in the last 168 hours. No results for input(s): AMMONIA in the last 168 hours.  ABG No results found for: PHART, PCO2ART, PO2ART, HCO3, TCO2, ACIDBASEDEF, O2SAT   Coagulation Profile: No results for input(s): INR, PROTIME in the last 168 hours.  Cardiac Enzymes: No results for input(s): CKTOTAL, CKMB, CKMBINDEX, TROPONINI in the last 168 hours.  HbA1C: No results found for: HGBA1C  CBG: Recent Labs  Lab 05/22/20 0834 05/23/20 0738  GLUCAP 136* 79    Review of Systems:   Unobtainable 2/2 pt's intubation status.   Past Medical History  She,  has a past medical history of Medical history non-contributory, No pertinent past medical history, Pregnancy induced hypertension, and Urinary tract infection.   Surgical History    Past Surgical History:  Procedure Laterality Date  . CESAREAN SECTION    . CESAREAN  SECTION N/A 02/16/2013   Procedure: Repeat CESAREAN SECTION of baby boy at 0221 APGAR 7/9;  Surgeon: 9/9, MD;  Location: [facility];  Service: Obstetrics;  Laterality: N/A;  . TONSILLECTOMY       Social History   reports that she has never smoked. She has never used smokeless tobacco. She reports that she does not drink alcohol and does not use drugs.   Family History   Her family history includes Birth defects in her son; Hypertension in her mother.   Allergies No Known Allergies   Home Medications  Prior to Admission medications   Medication Sig Start Date End Date Taking? Authorizing Provider  amoxicillin-clavulanate (AUGMENTIN) 875-125 MG tablet Take 1 tablet by mouth every 12 (twelve) hours. 05/11/20  Yes [provider]  ibuprofen (ADVIL) 200 MG tablet Take 800 mg by mouth 3 (three) times daily as needed (pain).   Yes [provider]  Critical care time: The patient is critically ill with multiple organ systems failure and requires high complexity decision making for assessment and support, frequent evaluation and titration of therapies, application of advanced monitoring technologies and extensive interpretation of multiple databases.  Critical care time 41 mins. This represents my time independent of the NP's/PA's/med students/residents time taking care of the pt. This is excluding procedures.     Briant Sites DO [facility] 05/23/2020, 6:06 PM

## 2020-05-23 NOTE — Anesthesia Procedure Notes (Addendum)
Procedure Name: Awake intubation Date/Time: 05/23/2020 4:12 PM Performed by: Elliot Dally, CRNA Pre-anesthesia Checklist: Patient identified, Emergency Drugs available, Suction available and Patient being monitored Patient Re-evaluated:Patient Re-evaluated prior to induction Oxygen Delivery Method: Circle System Utilized Preoxygenation: Pre-oxygenation with 100% oxygen Induction Type: IV induction Ventilation: Nasal airway inserted- appropriate to patient size Laryngoscope Size: Glidescope and 3 Grade View: Grade I Nasal Tubes: Left, Nasal Rae and Nasal prep performed Tube size: 7.0 mm Number of attempts: 4 Airway Equipment and Method: Stylet and Video-laryngoscopy Placement Confirmation: ETT inserted through vocal cords under direct vision,  positive ETCO2 and breath sounds checked- equal and bilateral Tube secured with: Tape Dental Injury: Teeth and Oropharynx as per pre-operative assessment, Injury to lip and Bloody posterior oropharynx  Difficulty Due To: Difficulty was anticipated, Difficult Airway- due to large tongue, Difficult Airway- due to reduced neck mobility, Difficult Airway- due to dentition, Difficult Airway- due to limited oral opening, Difficult Airway-  due to edematous airway and Difficult Airway- due to anterior larynx Comments: Patient with extremely limited oral opening 2/2 submandibular abscess/swelling. Nasal intubation with fiberoptic scope attempted by Dr. Salvadore Farber, unable to pass ETT through cords. Second attempt to nasal intubate using glide scope 3, nasal rae successfully passed through vocal cords. Patient remained spontaneously breathing throughout. Copious secretions, as well as some posterior oropharynx bleeding and swelling. Decision to leave patient intubated overnight and send to ICU to allow for decreased airway swelling.

## 2020-05-24 ENCOUNTER — Encounter (HOSPITAL_COMMUNITY): Payer: Self-pay | Admitting: Oral Surgery

## 2020-05-24 ENCOUNTER — Inpatient Hospital Stay (HOSPITAL_COMMUNITY): Payer: Medicaid Other

## 2020-05-24 DIAGNOSIS — J9601 Acute respiratory failure with hypoxia: Secondary | ICD-10-CM

## 2020-05-24 DIAGNOSIS — Z9911 Dependence on respirator [ventilator] status: Secondary | ICD-10-CM

## 2020-05-24 DIAGNOSIS — B9689 Other specified bacterial agents as the cause of diseases classified elsewhere: Secondary | ICD-10-CM

## 2020-05-24 DIAGNOSIS — K029 Dental caries, unspecified: Secondary | ICD-10-CM

## 2020-05-24 DIAGNOSIS — J384 Edema of larynx: Secondary | ICD-10-CM

## 2020-05-24 DIAGNOSIS — K9189 Other postprocedural complications and disorders of digestive system: Secondary | ICD-10-CM

## 2020-05-24 LAB — BASIC METABOLIC PANEL
Anion gap: 11 (ref 5–15)
BUN: <5 mg/dL — ABNORMAL LOW (ref 6–20)
CO2: 25 mmol/L (ref 22–32)
Calcium: 8.7 mg/dL — ABNORMAL LOW (ref 8.9–10.3)
Chloride: 102 mmol/L (ref 98–111)
Creatinine, Ser: 0.83 mg/dL (ref 0.44–1.00)
GFR calc Af Amer: >60 mL/min (ref >60)
GFR calc non Af Amer: >60 mL/min (ref >60)
Glucose, Bld: 143 mg/dL — ABNORMAL HIGH (ref 70–99)
Potassium: 4.2 mmol/L (ref 3.5–5.1)
Sodium: 138 mmol/L (ref 135–145)

## 2020-05-24 LAB — GLUCOSE, CAPILLARY
Glucose-Capillary: 137 mg/dL — ABNORMAL HIGH (ref 70–99)
Glucose-Capillary: 138 mg/dL — ABNORMAL HIGH (ref 70–99)
Glucose-Capillary: 130 mg/dL — ABNORMAL HIGH (ref 70–99)
Glucose-Capillary: 120 mg/dL — ABNORMAL HIGH (ref 70–99)
Glucose-Capillary: 117 mg/dL — ABNORMAL HIGH (ref 70–99)
Glucose-Capillary: 125 mg/dL — ABNORMAL HIGH (ref 70–99)

## 2020-05-24 LAB — CBC
HCT: 37 % (ref 36.0–46.0)
Hemoglobin: 12 g/dL (ref 12.0–15.0)
MCH: 28 pg (ref 26.0–34.0)
MCHC: 32.4 g/dL (ref 30.0–36.0)
MCV: 86.4 fL (ref 80.0–100.0)
Platelets: 247 10*3/uL (ref 150–400)
RBC: 4.28 MIL/uL (ref 3.87–5.11)
RDW: 13.7 % (ref 11.5–15.5)
WBC: 11.2 10*3/uL — ABNORMAL HIGH (ref 4.0–10.5)
nRBC: 0 % (ref 0.0–0.2)

## 2020-05-24 LAB — HEMOGLOBIN A1C
Hgb A1c MFr Bld: 5.5 % (ref 4.8–5.6)
Mean Plasma Glucose: 111.15 mg/dL

## 2020-05-24 MED ORDER — PROPOFOL 1000 MG/100ML IV EMUL
INTRAVENOUS | Status: AC
  Filled 2020-05-24: qty 100

## 2020-05-24 MED ORDER — PHENYLEPHRINE HCL-NACL 10-0.9 MG/250ML-% IV SOLN
INTRAVENOUS | Status: AC
  Filled 2020-05-24: qty 250

## 2020-05-24 MED ORDER — ORAL CARE MOUTH RINSE
15.0000 mL | Freq: Two times a day (BID) | OROMUCOSAL | Status: DC
  Administered 2020-05-25 – 2020-05-27 (×4): 15 mL via OROMUCOSAL

## 2020-05-24 NOTE — Progress Notes (Signed)
NAME:  Holly Terrell, MRN:  [id number], DOB:  [birth year], LOS: 3 ADMISSION DATE:  05/21/2020, CONSULTATION DATE:  05/23/20 REFERRING MD:  Dr. Oswaldo Done, CHIEF COMPLAINT:  Post operative resp failure  Brief History   39 yo with R jaw pain and swelling found to have a R submandibular infection from abscessed R molars. Taken to OR for extraction and I&D  History of present illness   40 yo with no pmh presented with >3 weeks of tooth pain and jaw pain. Previously seen and given augmentin which she reportedly completed. Presented today unable to open mouth 2/2 pain and swelling. Found to have R submandibular abscess 2/2 molars. She was started on iv abx on 8/7 when she was admitted on 8/9 oral surgery was consulted and on 8/9 she underwent teeth extraction, I&D with penrose drain in place. She has been left intubated at this time 2/2 edema (nasally intubated).   CCM has been asked to consult 2/2 intubation status. All history is obtained from chart review as pt is intubated and sedated at this time.   Past Medical History   Past Medical History:  Diagnosis Date  . Medical history non-contributory   . No pertinent past medical history   . Pregnancy induced hypertension   . Urinary tract infection     Significant Hospital Events   Admitted 8/6  Consults:  Oral surgery 8/9  Procedures:  8/9: R submandibular abscess I&D, teeth extraction and tongue rind removal  Significant Diagnostic Tests:  Ct soft tissue neck 8/7: 3.2 x 3.0 x 1.6 cm peripherally enhancing collection within the right submandibular space consistent with abscess. Surrounding cellulitis. Additionally, subtle edema is questioned within the right posterior floor of mouth suspicious for associated floor of mouth infection. The right posterior mandibular molar is carious and findings may be odontogenic in origin.  9 mm nonspecific cutaneous/subcutaneous lesion of the right cheek. Direct visualization recommended.   Micro  Data:  8/7 sars2: neg 8/7 strep: neg 8/9 intraop cx: pending  Antimicrobials:  clinda 8/7 unasyn 8/7->  Interim history/subjective:  8/9: pt intubated and heavily sedated to protect nasally intubated status.   Objective   Blood pressure (!) 151/97, pulse 95, temperature 98.6 F (37 C), temperature source Axillary, resp. rate 18, height 5' (1.524 m), weight 94.8 kg, last menstrual period 05/07/2020, SpO2 96 %.    Vent Mode: PRVC FiO2 (%):  [40 %-100 %] 40 % Set Rate:  [16 bmp] 16 bmp Vt Set:  [360 mL] 360 mL PEEP:  [5 cmH20] 5 cmH20 Plateau Pressure:  [20 cmH20-22 cmH20] 22 cmH20   Intake/Output Summary (Last 24 hours) at 05/24/2020 0824 Last data filed at 05/24/2020 0800 Gross per 24 hour  Intake 3622.75 ml  Output 770 ml  Net 2852.75 ml   Filed Weights   05/21/20 0759 05/21/20 0805 05/23/20 1500  Weight: 94.8 kg 94.8 kg 94.8 kg    Examination: Physical Exam Constitutional:      Comments: Intubated, sedated.   HENT:     Head: Normocephalic.  Eyes:     Conjunctiva/sclera: Conjunctivae normal.     Pupils: Pupils are equal, round, and reactive to light.  Neck:     Comments: Swollen neck with dressing intact. No erythema or purulence noted.  Cardiovascular:     Rate and Rhythm: Normal rate and regular rhythm.     Pulses: Normal pulses.     Heart sounds: Normal heart sounds. No murmur heard.  No friction rub. No gallop.   Pulmonary:  Effort: Pulmonary effort is normal.     Breath sounds: Normal breath sounds. No wheezing, rhonchi or rales.  Abdominal:     General: Abdomen is flat. Bowel sounds are normal.  Musculoskeletal:     Right lower leg: No edema.     Left lower leg: No edema.     Resolved Hospital Problem list    Assessment & Plan:  Post operative resp insuff:  Swelling improved this morning, will continue to wean off the vent with plans to extubate if patient tolerates.  - Titrate vent - Plan to extubate once edema begins to subside.  -  Sedation only with propofol, fentanyl  - Decadron Q6H   R submandibular abscess:  - Appreciate oral surgery's recommendations. - s/p surgical intervention - Cont to follow cx - Continue Unasyn - Cont pain control on Oxycodone 5 mg/38ml solution 5 mg  Best practice:  Diet: npo Pain/Anxiety/Delirium protocol (if indicated): per protocol VAP protocol (if indicated): per protocol DVT prophylaxis: per surgery GI prophylaxis: per protocol Glucose control: monitoring Mobility: bedrest Code Status: full Family Communication: per primary Disposition: ICU  Labs   CBC: Recent Labs  Lab 05/21/20 0807 05/22/20 0151 05/23/20 0113 05/23/20 2121 05/24/20 0507  WBC 8.3 9.9 8.7  --  11.2*  NEUTROABS 5.4  --   --   --   --   HGB 12.2 12.1 10.8* 12.2 12.0  HCT 38.8 37.0 33.7* 36.0 37.0  MCV 86.8 85.5 87.3  --  86.4  PLT 240 258 227  --  247    Basic Metabolic Panel: Recent Labs  Lab 05/21/20 0807 05/22/20 0151 05/23/20 0113 05/23/20 2121 05/24/20 0507  NA 139 137 140 139 138  K 3.7 4.1 3.8 4.4 4.2  CL 103 104 106  --  102  CO2 26 22 26   --  25  GLUCOSE 126* 123* 101*  --  143*  BUN <5* 5* 5*  --  <5*  CREATININE 0.84 0.67 0.81  --  0.83  CALCIUM 9.1 8.9 8.4*  --  8.7*   GFR: Estimated Creatinine Clearance: 95.5 mL/min (by C-G formula based on SCr of 0.83 mg/dL). Recent Labs  Lab 05/21/20 0807 05/21/20 1148 05/22/20 0151 05/23/20 0113 05/24/20 0507  WBC 8.3  --  9.9 8.7 11.2*  LATICACIDVEN 2.0* 1.0  --  1.2  --     Liver Function Tests: Recent Labs  Lab 05/21/20 0807 05/23/20 0113  AST 44* 42*  ALT 54* 71*  ALKPHOS 90 76  BILITOT 0.7 0.5  PROT 7.0 5.6*  ALBUMIN 3.3* 2.9*   No results for input(s): LIPASE, AMYLASE in the last 168 hours. No results for input(s): AMMONIA in the last 168 hours.  ABG    Component Value Date/Time   PHART 7.383 05/23/2020 2121   PCO2ART 45.1 05/23/2020 2121   PO2ART 149 (H) 05/23/2020 2121   HCO3 26.9 05/23/2020 2121    TCO2 28 05/23/2020 2121   O2SAT 99.0 05/23/2020 2121     Coagulation Profile: No results for input(s): INR, PROTIME in the last 168 hours.  Cardiac Enzymes: No results for input(s): CKTOTAL, CKMB, CKMBINDEX, TROPONINI in the last 168 hours.  HbA1C: Hgb A1c MFr Bld  Date/Time Value Ref Range Status  05/24/2020 05:07 AM 5.5 4.8 - 5.6 % Final    Comment:    (NOTE) Pre diabetes:          5.7%-6.4%  Diabetes:              >  6.4%  Glycemic control for   <7.0% adults with diabetes     CBG: Recent Labs  Lab 05/23/20 0738 05/23/20 2100 05/23/20 2325 05/24/20 0353 05/24/20 0751  GLUCAP 79 117* 157* 137* 138*    Review of Systems:   Unobtainable 2/2 pt's intubation status.   Past Medical History  She,  has a past medical history of Medical history non-contributory, No pertinent past medical history, Pregnancy induced hypertension, and Urinary tract infection.   Surgical History    Past Surgical History:  Procedure Laterality Date  . CESAREAN SECTION    . CESAREAN SECTION N/A 02/16/2013   Procedure: Repeat CESAREAN SECTION of baby boy at 0221 APGAR 7/9;  Surgeon: Tilda Burrow, MD;  Location: [facility];  Service: Obstetrics;  Laterality: N/A;  . INCISION AND DRAINAGE ABSCESS Right 05/23/2020   Procedure: INCISION AND DRAINAGE ABSCESS RIGHT NECK;  Surgeon: Ocie Doyne, DDS;  Location: [facility] OR;  Service: Oral Surgery;  Laterality: Right;  . TONSILLECTOMY    . TOOTH EXTRACTION N/A 05/23/2020   Procedure: DENTAL RESTORATION/EXTRACTIONS;  Surgeon: Ocie Doyne, DDS;  Location: [facility] OR;  Service: Oral Surgery;  Laterality: N/A;     Social History   reports that she has never smoked. She has never used smokeless tobacco. She reports that she does not drink alcohol and does not use drugs.   Family History   Her family history includes Birth defects in her son; Hypertension in her mother.   Allergies No Known Allergies   Home Medications  Prior to Admission medications     Medication Sig Start Date End Date Taking? Authorizing Provider  amoxicillin-clavulanate (AUGMENTIN) 875-125 MG tablet Take 1 tablet by mouth every 12 (twelve) hours. 05/11/20  Yes [provider]  ibuprofen (ADVIL) 200 MG tablet Take 800 mg by mouth 3 (three) times daily as needed (pain).   Yes [provider]     Critical care time:    Dolan Amen, MD IMTS, PGY-2 Pager: [phone] 05/24/2020,12:32 PM

## 2020-05-24 NOTE — Progress Notes (Signed)
Kanyla Odem PROGRESS NOTE:   39 yo F morbid obesity s/p incsion and drainage right submandibular space abscess and dental extractions. Difficult intubation, intubated for airway protection.  Vitals: Blood pressure (!) 146/72, pulse 70, temperature 98.2 F (36.8 C), temperature source Oral, resp. rate 16, height 5' (1.524 m), weight 94.8 kg, last menstrual period 05/07/2020, SpO2 97 %. Lab results: Results for orders placed or performed during the hospital encounter of 05/21/20 (from the past 24 hour(s))  MRSA PCR Screening     Status: None   Collection Time: 05/23/20  2:17 PM   Specimen: Nasal Mucosa; Nasopharyngeal  Result Value Ref Range   MRSA by PCR NEGATIVE NEGATIVE  Aerobic/Anaerobic Culture (surgical/deep wound)     Status: None (Preliminary result)   Collection Time: 05/23/20  3:51 PM   Specimen: PATH Other; Tissue  Result Value Ref Range   Specimen Description ABSCESS RIGHT NECK    Special Requests NONE    Gram Stain      NO WBC SEEN RARE GRAM POSITIVE COCCI IN PAIRS RARE GRAM POSITIVE RODS Performed at [facility], [address], [place], [place] [address]    Culture PENDING    Report Status PENDING   Glucose, capillary     Status: Abnormal   Collection Time: 05/23/20  9:00 PM  Result Value Ref Range   Glucose-Capillary 117 (H) 70 - 99 mg/dL  I-STAT 7, (LYTES, BLD GAS, ICA, H+H)     Status: Abnormal   Collection Time: 05/23/20  9:21 PM  Result Value Ref Range   pH, Arterial 7.383 7.35 - 7.45   pCO2 arterial 45.1 32 - 48 mmHg   pO2, Arterial 149 (H) 83 - 108 mmHg   Bicarbonate 26.9 20.0 - 28.0 mmol/L   TCO2 28 22 - 32 mmol/L   O2 Saturation 99.0 %   Acid-Base Excess 1.0 0.0 - 2.0 mmol/L   Sodium 139 135 - 145 mmol/L   Potassium 4.4 3.5 - 5.1 mmol/L   Calcium, Ion 1.17 1.15 - 1.40 mmol/L   HCT 36.0 36 - 46 %   Hemoglobin 12.2 12.0 - 15.0 g/dL   Patient temperature 50.9 F    Collection site Radial    Drawn by HIDE    Sample type ARTERIAL    Triglycerides     Status: Abnormal   Collection Time: 05/23/20  9:38 PM  Result Value Ref Range   Triglycerides 194 (H) <150 mg/dL  Glucose, capillary     Status: Abnormal   Collection Time: 05/23/20 11:25 PM  Result Value Ref Range   Glucose-Capillary 157 (H) 70 - 99 mg/dL  Glucose, capillary     Status: Abnormal   Collection Time: 05/24/20  3:53 AM  Result Value Ref Range   Glucose-Capillary 137 (H) 70 - 99 mg/dL  CBC     Status: Abnormal   Collection Time: 05/24/20  5:07 AM  Result Value Ref Range   WBC 11.2 (H) 4.0 - 10.5 K/uL   RBC 4.28 3.87 - 5.11 MIL/uL   Hemoglobin 12.0 12.0 - 15.0 g/dL   HCT 32.6 36 - 46 %   MCV 86.4 80.0 - 100.0 fL   MCH 28.0 26.0 - 34.0 pg   MCHC 32.4 30.0 - 36.0 g/dL   RDW 71.2 45.8 - 09.9 %   Platelets 247 150 - 400 K/uL   nRBC 0.0 0.0 - 0.2 %  Basic metabolic panel     Status: Abnormal   Collection Time: 05/24/20  5:07  AM  Result Value Ref Range   Sodium 138 135 - 145 mmol/L   Potassium 4.2 3.5 - 5.1 mmol/L   Chloride 102 98 - 111 mmol/L   CO2 25 22 - 32 mmol/L   Glucose, Bld 143 (H) 70 - 99 mg/dL   BUN <5 (L) 6 - 20 mg/dL   Creatinine, Ser 2.35 0.44 - 1.00 mg/dL   Calcium 8.7 (L) 8.9 - 10.3 mg/dL   GFR calc non Af Amer >60 >60 mL/min   GFR calc Af Amer >60 >60 mL/min   Anion gap 11 5 - 15  Hemoglobin A1c     Status: None   Collection Time: 05/24/20  5:07 AM  Result Value Ref Range   Hgb A1c MFr Bld 5.5 4.8 - 5.6 %   Mean Plasma Glucose 111.15 mg/dL   Radiology Results: DG CHEST PORT 1 VIEW  Result Date: 05/23/2020 CLINICAL DATA:  Intubated EXAM: PORTABLE CHEST 1 VIEW COMPARISON:  None. FINDINGS: Endotracheal tube is 2 cm above the carina. Mild vascular congestion. Low lung volumes with bibasilar atelectasis. Perihilar opacities could reflect early edema. Heart is borderline in size. IMPRESSION: Low lung volumes with perihilar opacities which could reflect early/mild edema. Bibasilar atelectasis. Electronically Signed   By: Charlett Nose  M.D.   On: 05/23/2020 18:20   General appearance: sedated and intubated Head: Normocephalic, without obvious abnormality, atraumatic Eyes: negative Nose: Nares normal. Septum midline. Mucosa normal. No drainage or sinus tenderness. Throat: extraction sites hemostatic. Minimal edema. no purulence. Neck: Drain intact, miinimal drainage.  ASSESSMENT: Stable s/p I an D right submandibular spcae, dental extractions.  PLAN: Extubate per critical care. Continue IV antibiotics.   Ocie Doyne 05/24/2020

## 2020-05-24 NOTE — Op Note (Signed)
NAMEALOMA, Holly Terrell MEDICAL RECORD BO:[id number] ACCOUNT [id number] DATE OF BIRTH:1981/07/13 FACILITY: [facility] LOCATION: [facility] PHYSICIAN:Harsh Trulock M. Shivank Pinedo, DDS  OPERATIVE REPORT  DATE OF PROCEDURE:  05/23/2020  PREOPERATIVE DIAGNOSES:   1.  Right submandibular space abscess,  2.  Nonrestorable teeth secondary to dental caries.  POSTOPERATIVE DIAGNOSES:   1.  Right submandibular space abscess. 2.  Nonrestorable teeth secondary to dental caries.   3.  Nonrestorable teeth numbers 30 and 31.  PROCEDURES:   1.  Incision and drainage, right submandibular space infection.   2.  Extraction teeth of 30 and 31.  SURGEON:  Ocie Doyne, DDS  ANESTHESIA:  General, oral intubation.  DESCRIPTION OF PROCEDURE:  The patient was placed on the operating table in the supine position.  Sedation was administered and attempts were made to nasally intubate with the scope.  These were unsuccessful.  Attempt was then made using a GlideScope  with a fresh nasal tube and this was used for successful intubation.  The tube was secured, the eyes were protected.  The patient was prepped and draped for surgery.  A timeout was performed.  A marking pen was used to demarcate the area of greatest  fluctuance, which was in the tissue approximately 3 cm below the inferior border of the mandible in the premolar region.  Local anesthesia 2% lidocaine 1:100,000 epinephrine was infiltrated in this area and then a 15 blade was used to make an incision  through skin and subcutaneous tissue.  Then, hemostats were inserted and blunt dissection was carried forward towards the mandibular inferior border in the molar region.  There was gross purulent material, approximately 10 mL that was evacuated.   Cultures were taken for aerobic and anaerobic, as well as Gram stain.  Then, the hemostats were taken all the way to the inferior border of the mandible and to the lingual aspect of it.   Loculations were opened with a hemostat and then the  area was  irrigated out with normal saline.  Attention was turned to the oral cavity.  The bite block was placed in the mouth and a throat pack was placed in the pharynx.  Lidocaine 2% with 1:100,000 epinephrine was infiltrated in an inferior alveolar block and  then local infiltration around the teeth to be removed.  The teeth were examined and found to have large caries, numbers 30 and 31.  The teeth were then elevated using a 301 elevator after reflecting the periosteum with a periosteal elevator.  Then,  dental forceps was used to remove the teeth.  The sockets were curetted and then irrigated.  Then, a quarter-inch Penrose drain was placed into the neck incision.  Palpation was used to determine that the drain did in fact go to the lingual aspect of the  mandible in the area of the molars.  Then, the drain was sutured to the skin with 3-0 nylon.  The oral cavity was then irrigated and suctioned and the throat pack was removed.  The patient was left in care of Anesthesia for transport to ICU for  overnight intubation due to difficult airway and possible airway edema.  ESTIMATED BLOOD LOSS:  Minimal.  COMPLICATIONS:  Difficult airway.  DRAINS:  Quarter-inch Penrose, right neck.  VN/NUANCE  D:05/23/2020 T:05/23/2020 JOB:[id number]

## 2020-05-24 NOTE — Progress Notes (Signed)
RT note-Cuff leak tested, positive and then placed to wean 10/5, tolerating well at this time, continue to monitor.

## 2020-05-24 NOTE — Progress Notes (Signed)
   Subjective:  Holly Terrell is a 39 y.o.  admit for submandibular abscess on hospital day 3  Holly Terrell was examined and evaluated at bedside this am. She was noted to be intubated. On sedation.  Objective:  Vital signs in last 24 hours: Vitals:   05/24/20 1000 05/24/20 1100 05/24/20 1118 05/24/20 1200  BP: (!) 156/81 (!) 147/80  (!) 153/86  Pulse: 72 62  66  Resp: 16 16  12   Temp:   98.6 F (37 C)   TempSrc:   Axillary   SpO2: 97% 96%  98%  Weight:      Height:       Gen: Sedated, minimally responsive HEENT: Intubated Neck: Surgical site dressing intact, no obvious drainage CV: RRR, S1, S2 normal Pulm: CTAB, No rales, no wheezes  Assessment/Plan:  Active Problems:   Submandibular abscess  Holly Terrell is a 39 yo F admit for submandibular abscess  Submandibular Abscess Post op day 1. Went to OR for I&D yesterday. On Unasyn day 4. OR culture w/ gram positive cocci in pairs and gram positive rods. Pending speciation. Afebrile. Remained intubated due to swelling.  - Appreciate OMFS recs: C/w IV abx - C/w Unasyn - F/u cultures - Transfer to progressive when extubated  Intubation for Post-operative airway protection Remained intubated post-op due to difficult airway noted prior to OR. PCCM following for vent management - Extubate per PCCM - C/w decadron q6hr   DVT prophx: Lovenox Diet: NPO Code: Full  Prior to Admission Living Arrangement: Home Anticipated Discharge Location: Home Barriers to Discharge: Medical treatment  Dispo: Anticipated discharge in approximately 1-2 day(s).   30, MD 05/24/2020, 1:13 PM Pager: [phone] After 5pm on weekdays and 1pm on weekends: On Call Pager: [phone]

## 2020-05-24 NOTE — Progress Notes (Signed)
Patient continues to complain of needing to urinate 300 mls has come via [place] .Bladder scan show 520 . I&O cath per protocol for 900 mls clear yellow urine out

## 2020-05-25 DIAGNOSIS — B954 Other streptococcus as the cause of diseases classified elsewhere: Secondary | ICD-10-CM

## 2020-05-25 DIAGNOSIS — R252 Cramp and spasm: Secondary | ICD-10-CM

## 2020-05-25 DIAGNOSIS — D62 Acute posthemorrhagic anemia: Secondary | ICD-10-CM

## 2020-05-25 LAB — BASIC METABOLIC PANEL
Anion gap: 10 (ref 5–15)
BUN: 7 mg/dL (ref 6–20)
CO2: 26 mmol/L (ref 22–32)
Calcium: 8.5 mg/dL — ABNORMAL LOW (ref 8.9–10.3)
Chloride: 106 mmol/L (ref 98–111)
Creatinine, Ser: 0.8 mg/dL (ref 0.44–1.00)
GFR calc Af Amer: >60 mL/min (ref >60)
GFR calc non Af Amer: >60 mL/min (ref >60)
Glucose, Bld: 117 mg/dL — ABNORMAL HIGH (ref 70–99)
Potassium: 3.8 mmol/L (ref 3.5–5.1)
Sodium: 142 mmol/L (ref 135–145)

## 2020-05-25 LAB — CBC
HCT: 32.5 % — ABNORMAL LOW (ref 36.0–46.0)
Hemoglobin: 10.4 g/dL — ABNORMAL LOW (ref 12.0–15.0)
MCH: 27.9 pg (ref 26.0–34.0)
MCHC: 32 g/dL (ref 30.0–36.0)
MCV: 87.1 fL (ref 80.0–100.0)
Platelets: 232 10*3/uL (ref 150–400)
RBC: 3.73 MIL/uL — ABNORMAL LOW (ref 3.87–5.11)
RDW: 14.2 % (ref 11.5–15.5)
WBC: 9.4 10*3/uL (ref 4.0–10.5)
nRBC: 0 % (ref 0.0–0.2)

## 2020-05-25 LAB — GLUCOSE, CAPILLARY
Glucose-Capillary: 115 mg/dL — ABNORMAL HIGH (ref 70–99)
Glucose-Capillary: 95 mg/dL (ref 70–99)
Glucose-Capillary: 92 mg/dL (ref 70–99)

## 2020-05-25 MED ORDER — PANTOPRAZOLE SODIUM 40 MG PO TBEC
40.0000 mg | DELAYED_RELEASE_TABLET | Freq: Every day | ORAL | Status: DC
  Administered 2020-05-26 – 2020-05-27 (×2): 40 mg via ORAL
  Filled 2020-05-25 (×2): qty 1

## 2020-05-25 NOTE — Progress Notes (Addendum)
   Subjective:  Holly Terrell is a 39 y.o.  admit for submandibular abscess on hospital day 4  Holly Terrell was examined and evaluated at bedside this am. She was observed to be resting comfortably at bedside chair. She mentions continuing to have pain of her throat but denies any worsening fevers, chills, dyspnea. She mentions Dr.Jensen planning on taking the drain out tomorrow.  Objective:  Vital signs in last 24 hours: Vitals:   05/25/20 0700 05/25/20 0739 05/25/20 0800 05/25/20 0900  BP: (!) 105/58  116/62   Pulse: (!) 59  91 96  Resp: 12  11 15   Temp:  98.2 F (36.8 C)    TempSrc:  Axillary    SpO2: 96%  98% 97%  Weight:      Height:       Gen: Well-developed, well nourished, NAD Neck: Swollen, tender neck w/ erythema. Penrose drain in place w/ no significant output. Pulm: Breathing comfortably on room air, no cough, no distress  Extm: ROM intact, No peripheral edema  Assessment/Plan:  Active Problems:   Submandibular abscess   Acute hypoxemic respiratory failure (HCC)  Holly Terrell is a 39 yo F admit for submandibular abscess  Submandibular Abscess Post op day 2. Went to OR for I&D yesterday. On Unasyn day54. OR culture re-incubated for better growth but gram stain showed gram positive cocci in pairs and gram positive rods. Pending speciation. Afebrile. No significant drainage from penrose. - Appreciate OMFS recs: C/w IV abx - C/w Unasyn - F/u cultures - Transfer to floor  Post-operative anemia Hemoglobin trend 12.2->12.0->10.4. Likely in part due to dilution from multiple fluid infusions in OR and while intubated. No evidence of current bleed. - Monitor  Intubation for Post-operative airway protection Extubated yesterday w/o complications per PCCM - Resolved  DVT prophx: Lovenox Diet: Full liquid Code: Full  Prior to Admission Living Arrangement: Home Anticipated Discharge Location: Home Barriers to Discharge: Medical treatment  Dispo: Anticipated discharge in  approximately 1-2 day(s).   30, MD 05/25/2020, 10:54 AM Pager: [phone] After 5pm on weekdays and 1pm on weekends: On Call Pager: [phone]

## 2020-05-25 NOTE — Progress Notes (Signed)
Holly Terrell PROGRESS NOTE:   SUBJECTIVE: Minimal pain in neck. Drinking, eating som pudding. Wants transfer to floor  OBJECTIVE:  Vitals: Blood pressure (!) 96/56, pulse (!) 58, temperature 98.2 F (36.8 C), temperature source Axillary, resp. rate 15, height 5' (1.524 m), weight 94.8 kg, last menstrual period 05/07/2020, SpO2 97 %. Lab results: Results for orders placed or performed during the hospital encounter of 05/21/20 (from the past 24 hour(s))  Glucose, capillary     Status: Abnormal   Collection Time: 05/24/20  7:51 AM  Result Value Ref Range   Glucose-Capillary 138 (H) 70 - 99 mg/dL  Glucose, capillary     Status: Abnormal   Collection Time: 05/24/20 11:16 AM  Result Value Ref Range   Glucose-Capillary 130 (H) 70 - 99 mg/dL  Glucose, capillary     Status: Abnormal   Collection Time: 05/24/20  3:03 PM  Result Value Ref Range   Glucose-Capillary 120 (H) 70 - 99 mg/dL  Glucose, capillary     Status: Abnormal   Collection Time: 05/24/20  7:31 PM  Result Value Ref Range   Glucose-Capillary 117 (H) 70 - 99 mg/dL  Glucose, capillary     Status: Abnormal   Collection Time: 05/24/20 11:28 PM  Result Value Ref Range   Glucose-Capillary 125 (H) 70 - 99 mg/dL  Glucose, capillary     Status: Abnormal   Collection Time: 05/25/20  3:25 AM  Result Value Ref Range   Glucose-Capillary 115 (H) 70 - 99 mg/dL  CBC     Status: Abnormal   Collection Time: 05/25/20  6:49 AM  Result Value Ref Range   WBC 9.4 4.0 - 10.5 K/uL   RBC 3.73 (L) 3.87 - 5.11 MIL/uL   Hemoglobin 10.4 (L) 12.0 - 15.0 g/dL   HCT 41.5 (L) 36 - 46 %   MCV 87.1 80.0 - 100.0 fL   MCH 27.9 26.0 - 34.0 pg   MCHC 32.0 30.0 - 36.0 g/dL   RDW 83.0 94.0 - 76.8 %   Platelets 232 150 - 400 K/uL   nRBC 0.0 0.0 - 0.2 %  Basic metabolic panel     Status: Abnormal   Collection Time: 05/25/20  6:49 AM  Result Value Ref Range   Sodium 142 135 - 145 mmol/L   Potassium 3.8 3.5 - 5.1 mmol/L   Chloride 106 98 - 111 mmol/L   CO2  26 22 - 32 mmol/L   Glucose, Bld 117 (H) 70 - 99 mg/dL   BUN 7 6 - 20 mg/dL   Creatinine, Ser 0.88 0.44 - 1.00 mg/dL   Calcium 8.5 (L) 8.9 - 10.3 mg/dL   GFR calc non Af Amer >60 >60 mL/min   GFR calc Af Amer >60 >60 mL/min   Anion gap 10 5 - 15  Glucose, capillary     Status: None   Collection Time: 05/25/20  7:34 AM  Result Value Ref Range   Glucose-Capillary 95 70 - 99 mg/dL   Radiology Results: Portable Chest xray  Result Date: 05/24/2020 CLINICAL DATA:  Hypoxia EXAM: PORTABLE CHEST 1 VIEW COMPARISON:  May 23, 2020 FINDINGS: Endotracheal tube tip is 1.4 cm above the carina. No pneumothorax. There is focal airspace opacity in the medial left base. Lungs elsewhere clear. Heart is borderline prominent with pulmonary vascularity normal. No adenopathy. No bone lesions. IMPRESSION: Endotracheal tube as described without pneumothorax. Note that the endotracheal tube is fairly close to the carina. Airspace opacity noted medial left base, concerning for  focus of pneumonia. Lungs elsewhere clear. Stable cardiac prominence. Electronically Signed   By: Bretta Bang III M.D.   On: 05/24/2020 07:53   DG CHEST PORT 1 VIEW  Result Date: 05/23/2020 CLINICAL DATA:  Intubated EXAM: PORTABLE CHEST 1 VIEW COMPARISON:  None. FINDINGS: Endotracheal tube is 2 cm above the carina. Mild vascular congestion. Low lung volumes with bibasilar atelectasis. Perihilar opacities could reflect early edema. Heart is borderline in size. IMPRESSION: Low lung volumes with perihilar opacities which could reflect early/mild edema. Bibasilar atelectasis. Electronically Signed   By: Charlett Nose M.D.   On: 05/23/2020 18:20   General appearance: alert, cooperative and no distress Head: Normocephalic, without obvious abnormality, atraumatic Eyes: negative Nose: Nares normal. Septum midline. Mucosa normal. No drainage or sinus tenderness. Throat: Trismus to approx. 1 cm. Extraction sites hemostatic.  Neck: Drain intact right  neck. Minimal drainage.  ASSESSMENT: Improved s/p I and D, Dental extractions. WBC down to 9.4 from 11.2  PLAN: Continue IV antibiotics. Consider transfer to floor per hospitalist.   Ocie Doyne 05/25/2020

## 2020-05-25 NOTE — Progress Notes (Signed)
Patient transferred to [facility] room 24. Awake and alert. Denies pain at present. States "just a little sore". Oriuented to room. Call bell in reach. Visitor at bedside.

## 2020-05-26 LAB — BASIC METABOLIC PANEL
Anion gap: 9 (ref 5–15)
BUN: 5 mg/dL — ABNORMAL LOW (ref 6–20)
CO2: 27 mmol/L (ref 22–32)
Calcium: 8.1 mg/dL — ABNORMAL LOW (ref 8.9–10.3)
Chloride: 104 mmol/L (ref 98–111)
Creatinine, Ser: 0.82 mg/dL (ref 0.44–1.00)
GFR calc Af Amer: >60 mL/min (ref >60)
GFR calc non Af Amer: >60 mL/min (ref >60)
Glucose, Bld: 98 mg/dL (ref 70–99)
Potassium: 3.4 mmol/L — ABNORMAL LOW (ref 3.5–5.1)
Sodium: 140 mmol/L (ref 135–145)

## 2020-05-26 LAB — CBC
HCT: 31.8 % — ABNORMAL LOW (ref 36.0–46.0)
Hemoglobin: 9.9 g/dL — ABNORMAL LOW (ref 12.0–15.0)
MCH: 27.2 pg (ref 26.0–34.0)
MCHC: 31.1 g/dL (ref 30.0–36.0)
MCV: 87.4 fL (ref 80.0–100.0)
Platelets: 211 10*3/uL (ref 150–400)
RBC: 3.64 MIL/uL — ABNORMAL LOW (ref 3.87–5.11)
RDW: 14.2 % (ref 11.5–15.5)
WBC: 8 10*3/uL (ref 4.0–10.5)
nRBC: 0 % (ref 0.0–0.2)

## 2020-05-26 MED ORDER — SODIUM CHLORIDE 0.9 % IV SOLN
1.0000 g | INTRAVENOUS | Status: DC
  Administered 2020-05-26: 1 g via INTRAVENOUS
  Filled 2020-05-26 (×2): qty 10

## 2020-05-26 MED ORDER — ADULT MULTIVITAMIN LIQUID CH
15.0000 mL | Freq: Every day | ORAL | Status: DC
  Administered 2020-05-26 – 2020-05-27 (×2): 15 mL via ORAL
  Filled 2020-05-26 (×2): qty 15

## 2020-05-26 MED ORDER — ENSURE ENLIVE PO LIQD
237.0000 mL | Freq: Three times a day (TID) | ORAL | Status: DC
  Administered 2020-05-26 – 2020-05-27 (×4): 237 mL via ORAL

## 2020-05-26 NOTE — Progress Notes (Addendum)
Nutrition Follow-up  RD working remotely.  DOCUMENTATION CODES:   Morbid obesity  INTERVENTION:   -D/c MVI with minerals -15 ml liquid MVI daily -Ensure Enlive po TID, each supplement provides 350 kcal and 20 grams of protein -Magic cup TID with meals, each supplement provides 290 kcal and 9 grams of protein -Provided "Nutrition Recommendations After Oral Surgery" handout from [facility] Nutrition Care Manual; attached to AVS/ discharge instructions  NUTRITION DIAGNOSIS:   Increased nutrient needs related to post-op healing as evidenced by estimated needs.  Ongoing  GOAL:   Patient will meet greater than or equal to 90% of their needs  Progressing   MONITOR:   PO intake, Supplement acceptance, Diet advancement, Labs, Weight trends, Skin, I & O's  REASON FOR ASSESSMENT:   Malnutrition Screening Tool    ASSESSMENT:   Holly Terrell is a 39 yr old female with a PMHx of depression. She presents with severe right neck pain, erythema, and swelling.  8/9- s/p Procedure(s): EXTRACTION Teeth #30, 31 INCISION AND DRAINAGE Right Submandibular space infection  Reviewed I/O's: +380 ml x 24 hours and +4.4 L since admission  UOP: 420 ml x 24 hours  Attempted to speak with pt via phone call to hospital room phone, however, no answer.   Per chart review, pt is tolerating full liquids, mostly consuming liquids and pudding. No meal completion records since last visit. Per RN, pt is unable to swallow MVI.   Labs reviewed: CBGS: 92-115.  Diet Order:   Diet Order            Diet full liquid Room service appropriate? Yes; Fluid consistency: Thin  Diet effective now                 EDUCATION NEEDS:   No education needs have been identified at this time  Skin:  Skin Assessment: Skin Integrity Issues: Skin Integrity Issues:: Incisions Incisions: closed incision to neck  Last BM:  05/26/20  Height:   Ht Readings from Last 1 Encounters:  05/23/20 5' (1.524 m)    Weight:    Wt Readings from Last 1 Encounters:  05/23/20 94.8 kg    Ideal Body Weight:  45.5 kg  BMI:  Body mass index is 40.82 kg/m.  Estimated Nutritional Needs:   Kcal:  1600-1800  Protein:  90-105 grams  Fluid:  > 1.6 L    Levada Schilling, RD, LDN, CDCES Registered Dietitian II Certified Diabetes Care and Education Specialist Please refer to [facility] for RD and/or RD on-call/weekend/after hours pager

## 2020-05-26 NOTE — Progress Notes (Signed)
Phoenicia Keator PROGRESS NOTE:   SUBJECTIVE: Mild pain. Tolerating liquids. Not eating.  OBJECTIVE:  Vitals: Blood pressure 126/75, pulse 72, temperature 98 F (36.7 C), temperature source Oral, resp. rate 17, height 5' (1.524 m), weight 94.8 kg, last menstrual period 05/07/2020, SpO2 97 %. Lab results: Results for orders placed or performed during the hospital encounter of 05/21/20 (from the past 24 hour(s))  Glucose, capillary     Status: None   Collection Time: 05/25/20 11:15 AM  Result Value Ref Range   Glucose-Capillary 92 70 - 99 mg/dL  CBC     Status: Abnormal   Collection Time: 05/26/20  2:59 AM  Result Value Ref Range   WBC 8.0 4.0 - 10.5 K/uL   RBC 3.64 (L) 3.87 - 5.11 MIL/uL   Hemoglobin 9.9 (L) 12.0 - 15.0 g/dL   HCT 97.3 (L) 36 - 46 %   MCV 87.4 80.0 - 100.0 fL   MCH 27.2 26.0 - 34.0 pg   MCHC 31.1 30.0 - 36.0 g/dL   RDW 53.2 99.2 - 42.6 %   Platelets 211 150 - 400 K/uL   nRBC 0.0 0.0 - 0.2 %  Basic metabolic panel     Status: Abnormal   Collection Time: 05/26/20  2:59 AM  Result Value Ref Range   Sodium 140 135 - 145 mmol/L   Potassium 3.4 (L) 3.5 - 5.1 mmol/L   Chloride 104 98 - 111 mmol/L   CO2 27 22 - 32 mmol/L   Glucose, Bld 98 70 - 99 mg/dL   BUN 5 (L) 6 - 20 mg/dL   Creatinine, Ser 8.34 0.44 - 1.00 mg/dL   Calcium 8.1 (L) 8.9 - 10.3 mg/dL   GFR calc non Af Amer >60 >60 mL/min   GFR calc Af Amer >60 >60 mL/min   Anion gap 9 5 - 15   Radiology Results: No results found. General appearance: alert, cooperative and no distress Head: Normocephalic, without obvious abnormality, atraumatic Eyes: negative Nose: Nares normal. Septum midline. Mucosa normal. No drainage or sinus tenderness. Throat: Maximum opening 1 cm. Pharynx clear. No edema, purulence, fluctnance. Neck: marked anterior cervical adenopathy and Minimal drainage. Mild edema.  ASSESSMENT: Stable s/p I and D, dental extractions. Trismus persists with poor po intake.  PLAN: Drain removed bedside.  Continue IV antibiotics.    Ocie Doyne 05/26/2020

## 2020-05-26 NOTE — Progress Notes (Addendum)
   Subjective: Interviewed patient at bedside.  She reports still having a good amount of pain and is unable to eat because of it. She still cannot open her mouth due to pain. She reports not wanting to try eating today due to pain.  Drain was removed by dentist this morning.   Objective:  Vital signs in last 24 hours: Vitals:   05/25/20 1246 05/25/20 1613 05/25/20 2038 05/26/20 0508  BP: (!) 113/58 110/66 134/71 126/75  Pulse: 70 72 73 72  Resp: 16 18 17 17   Temp: 98.3 F (36.8 C) 98.2 F (36.8 C) 98.4 F (36.9 C) 98 F (36.7 C)  TempSrc: Oral Oral Oral Oral  SpO2: 100% 99% 97% 97%  Weight:      Height:       Physical Exam Vitals and nursing note reviewed.  Constitutional:      General: She is not in acute distress.    Appearance: Normal appearance. She is not ill-appearing or toxic-appearing.  HENT:     Head: Normocephalic and atraumatic.  Neck:     Comments: Dressing clean, dry, and intact. Cardiovascular:     Rate and Rhythm: Normal rate and regular rhythm.     Pulses: Normal pulses.          Radial pulses are 2+ on the right side and 2+ on the left side.       Dorsalis pedis pulses are 2+ on the right side and 2+ on the left side.     Heart sounds: Normal heart sounds, S1 normal and S2 normal. No murmur heard.   Pulmonary:     Effort: Pulmonary effort is normal. No respiratory distress.     Breath sounds: Normal breath sounds. No wheezing.  Musculoskeletal:     Right lower leg: No edema.     Left lower leg: No edema.  Neurological:     Mental Status: She is alert.      Assessment/Plan:  Active Problems:   Submandibular abscess   Acute hypoxemic respiratory failure (HCC)   39 year old person who is otherwise healthy admitted with a right-sided submandibular space abscess.   Submandibular Abscess: Postoperative day #3 after incision and drainage and removal of 2 molars.  Surgical culture growing Streptococcus constellatus, intermediate sensitivity to  penicillin. Afebrile. Penrose drain removed today. -Discontinue Unasyn given the sensitivities -Antibiotics with ceftriaxone today, convert to Cefdinir when she is able to take orals.  Would do a 7-day course from today.     Post-operative anemia Hemoglobin 9.9. Likely in part due to dilution from multiple fluid infusions in OR and while intubated. Still no evidence of current bleed. - Monitor   Intubation for Post-operative airway swelling (resolved): Status post treatment with dexamethasone.  Has done well since being extubated.   DVT prophx: Lovenox Diet: Full liquid Code: Full   Prior to Admission Living Arrangement: Home Anticipated Discharge Location: Home Barriers to Discharge: Ability to eat Dispo: Anticipated discharge in approximately 1-2 day(s).   30, MD 05/26/2020, 5:52 AM Pager: [phone] After 5pm on weekdays and 1pm on weekends: On Call pager [phone]

## 2020-05-26 NOTE — Progress Notes (Signed)
Wound care provided per order and tolerated well. No active bleeding with tx noted.  Will continue to monitor.

## 2020-05-26 NOTE — Discharge Instructions (Signed)
Nutrition Recommendations After Oral Surgery  After a dental extraction, you may experience some oral discomfort. Below are some suggestions that may help you choose foods that are nutritious and easily tolerated. Remember, good nutrition is essential for healing! First Meal after Extractions Try to eat foods from several of the following groups:  Carbohydrate: hot cereal or cream soups  Protein: soft cooked fish, cottage cheese  Vegetables: mashed potatoes or soft-cooked vegetables such as squash, carrots, sweet potatoes  Fruits: applesauce or mashed apples, canned fruits such as peaches or pears  Dairy: milk, yogurt, pudding, ice cream, sherbet, milkshakes, or fruit smoothies  Fluids: tea, juice, and water  Supplements: can be used if food is not tolerated Do not use straws to sip liquids. Follow guidelines provided by your dentist. Sample Menu Breakfast    cup cooked oatmeal 1 cup (8 oz milk)  cup applesauce Tea  Morning Snack  cup applesauce  cup cottage cheese 1 cup (8 oz) water  Lunch   1 cup creamed soup  cup canned fruit  cup chopped spinach 1 cup (8 oz) water  Afternoon Snack  cup mashed, soft-cooked carrots  cup yogurt 1 cup (8 oz) water  Evening Meal    cup mashed potatoes  cup tuna fish  cup mashed, soft-cooked mixed vegetables 8 oz water  Evening Snack  cup cream of wheat 1 cup (8 oz) water    When You Progress to Soft-Solid Foods  Carbohydrates: cold cereal soaked with milk, lentil soup with soaked crackers, cooked pasta, soft bread, rice, grains  Protein: scrambled eggs, canned beans (mashed with skins removed), egg salad, tuna salad  Vegetables: boiled or steamed vegetables  Fruits: canned fruit  Dairy: soft cheeses, cottage cheese As your mouth begins to feel better, try more foods such as soft fish, soft cheese, boiled chicken, soft rolls. Try new foods as tolerated!   Oral Surgery Sample 1-Day Menu  Breakfast 2 teaspoons  margarine, soft, tub 2 pancakes 1 scrambled egg 1/2 banana  Morning Snack 1 slice whole-wheat bread, soft 1 tablespoon cream cheese 1 cup water  Lunch 1 hard-boiled egg 2 tablespoons mayonnaise 1/3 cup pasta, soft 1 cup water  Afternoon Snack 1 cup cream-based soup 4 saltine crackers, softened in soup 1 cup water  Evening Meal 1/3 cup mashed beans 1/3 cup brown rice 1/2 cup boiled vegetables  Evening Snack 1 cup cereal 1/2 cup nonfat milk 1 cup water     Copyright 2021  [employer]. All rights reserved.

## 2020-05-27 LAB — CBC
HCT: 34.9 % — ABNORMAL LOW (ref 36.0–46.0)
Hemoglobin: 11.2 g/dL — ABNORMAL LOW (ref 12.0–15.0)
MCH: 28 pg (ref 26.0–34.0)
MCHC: 32.1 g/dL (ref 30.0–36.0)
MCV: 87.3 fL (ref 80.0–100.0)
Platelets: 243 10*3/uL (ref 150–400)
RBC: 4 MIL/uL (ref 3.87–5.11)
RDW: 14 % (ref 11.5–15.5)
WBC: 7.7 10*3/uL (ref 4.0–10.5)
nRBC: 0 % (ref 0.0–0.2)

## 2020-05-27 LAB — BASIC METABOLIC PANEL
Anion gap: 7 (ref 5–15)
BUN: 6 mg/dL (ref 6–20)
CO2: 30 mmol/L (ref 22–32)
Calcium: 8.6 mg/dL — ABNORMAL LOW (ref 8.9–10.3)
Chloride: 103 mmol/L (ref 98–111)
Creatinine, Ser: 0.8 mg/dL (ref 0.44–1.00)
GFR calc Af Amer: >60 mL/min (ref >60)
GFR calc non Af Amer: >60 mL/min (ref >60)
Glucose, Bld: 101 mg/dL — ABNORMAL HIGH (ref 70–99)
Potassium: 4 mmol/L (ref 3.5–5.1)
Sodium: 140 mmol/L (ref 135–145)

## 2020-05-27 MED ORDER — CEFDINIR 300 MG PO CAPS: 300.0000 mg | ORAL_CAPSULE | Freq: Two times a day (BID) | ORAL | 0 refills | Status: DC

## 2020-05-27 MED ORDER — WHITE PETROLATUM EX OINT
TOPICAL_OINTMENT | CUTANEOUS | Status: AC
  Filled 2020-05-27: qty 28.35

## 2020-05-27 MED ORDER — CEFDINIR 300 MG PO CAPS
300.0000 mg | ORAL_CAPSULE | Freq: Two times a day (BID) | ORAL | Status: DC
  Administered 2020-05-27: 300 mg via ORAL
  Filled 2020-05-27 (×2): qty 1

## 2020-05-27 MED ORDER — HYDROCODONE-ACETAMINOPHEN 5-325 MG PO TABS: 1.0000 | ORAL_TABLET | Freq: Four times a day (QID) | ORAL | 0 refills | Status: AC | PRN

## 2020-05-27 MED FILL — CEFDINIR 300 MG CAPSULE: 300 | 5 days supply | Qty: 11 | Fill #0

## 2020-05-27 MED FILL — HYDROCODON-APAP 5-325: 5-325 | 3 days supply | Qty: 12 | Fill #0

## 2020-05-27 NOTE — Progress Notes (Signed)
Sherian Munroe PROGRESS NOTE:   SUBJECTIVE: Mild discomfort. Feel a little weak.  OBJECTIVE:  Vitals: Blood pressure (!) 108/55, pulse 89, temperature 98.5 F (36.9 C), temperature source Oral, resp. rate 16, height 5' (1.524 m), weight 94.8 kg, last menstrual period 05/07/2020, SpO2 98 %. Lab results:No results found for this or any previous visit (from the past 24 hour(s)). Radiology Results: No results found. General appearance: alert, cooperative and no distress Head: Normocephalic, without obvious abnormality, atraumatic Eyes: negative Nose: Nares normal. Septum midline. Mucosa normal. No drainage or sinus tenderness. Throat: Trismus to approximately 15 mm. Pharynx clear. Neck: Mild edema right submandibular. No purulence, fluctuance.  ASSESSMENT: Improved s/p I and D, extractions. Stable for d/c from oral surgery standpoint.  PLAN: Per hospitalist.   Ocie Doyne 05/27/2020

## 2020-05-27 NOTE — Progress Notes (Signed)
Wound care provided per order and tolerated well.  Will continue to monitor. °

## 2020-05-27 NOTE — Progress Notes (Signed)
Discharge instructions reviewed at bedside with patient.  Patient verbalized understanding and declined further instruction.  Patient transported off unit via wheelchair and staff supervision for discharge.

## 2020-05-27 NOTE — Progress Notes (Signed)
   Subjective:  Interviewed patient at bedside.  She reports trying to eat this morning and able to open her mouth a little more than yesterday but still limited due to pain. Was able to eat some banana. Discussed will attempt oral antibiotic today. If she is able to swallow it then she will be able to go home.  Objective:  Vital signs in last 24 hours: Vitals:   05/26/20 0508 05/26/20 1402 05/26/20 2016 05/27/20 0428  BP: 126/75 117/62 131/73 (!) 108/55  Pulse: 72 80 84 89  Resp: 17  16 16   Temp: 98 F (36.7 C) 98.2 F (36.8 C) 98.2 F (36.8 C) 98.5 F (36.9 C)  TempSrc: Oral Oral Oral Oral  SpO2: 97%  100% 98%  Weight:      Height:       Physical Exam Vitals and nursing note reviewed.  Constitutional:      General: She is not in acute distress.    Appearance: Normal appearance. She is not ill-appearing or toxic-appearing.  HENT:     Head: Normocephalic.  Neck:     Comments: ROM has increased. Cardiovascular:     Rate and Rhythm: Normal rate and regular rhythm.     Pulses: Normal pulses.          Radial pulses are 2+ on the right side and 2+ on the left side.       Dorsalis pedis pulses are 2+ on the right side and 2+ on the left side.     Heart sounds: Normal heart sounds, S1 normal and S2 normal. No murmur heard.   Pulmonary:     Effort: Pulmonary effort is normal. No respiratory distress.     Breath sounds: Normal breath sounds. No wheezing.  Musculoskeletal:     Cervical back: Tenderness present.     Right lower leg: No edema.     Left lower leg: No edema.  Neurological:     Mental Status: She is alert. Mental status is at baseline.  Psychiatric:        Mood and Affect: Mood normal.        Behavior: Behavior normal.        Thought Content: Thought content normal.     Assessment/Plan:  Active Problems:   Submandibular abscess   Acute hypoxemic respiratory failure (HCC)   39 year old person who is otherwise healthy admitted with a right-sided  submandibular space abscess.   Submandibular Abscess: Postoperative day 4 after incision and drainage and removal of 2 molars.  Surgical culturegrowing Streptococcus constellatus, intermediate sensitivity to penicillin. Remains afebrile. -Discontinue Unasyn given the sensitivities -Will attempt oral Cefdinir today. As long as tolerated can discharge  (day 2 of 7)    Post-operative anemia Hemoglobin at 11.2 up from 9.9 yesterday. Still no evidence of current bleed. - Monitor   Intubation for Post-operative airway swelling (resolved): Status post treatment with dexamethasone. Has done well since being extubated.   DVT prophx: Lovenox Diet:Full liquid Code: Full   Prior to Admission Living Arrangement: Home Anticipated Discharge Location: Home Barriers to Discharge: ability to eat Dispo: Anticipated discharge in approximately 0-1 day(s).   30, MD 05/27/2020, 6:46 AM Pager: [phone] After 5pm on weekdays and 1pm on weekends: On Call pager [phone]

## 2020-05-28 LAB — AEROBIC/ANAEROBIC CULTURE W GRAM STAIN (SURGICAL/DEEP WOUND): Gram Stain: NONE SEEN

## 2020-08-29 IMAGING — CT CT NECK W/ CM
5 of 6 series · 14 of 33 positions shown, 16 images · IV contrast (omnipaque)
Comparison: No pertinent prior exams are available for comparison.

CLINICAL DATA: Cellulitis, neck; recent dental infection on
Augmentin, significant right-sided neck and submandibular swelling,
edema, trismus.

EXAM:
CT NECK WITH CONTRAST
TECHNIQUE: Multidetector CT imaging of the neck was performed using the
standard protocol following the bolus administration of intravenous
contrast.
CONTRAST:  75mL OMNIPAQUE IOHEXOL 300 MG/ML  SOLN

[Series 3: axial neck · axial · 0.49mm/px · z∈[-218,-146]mm · 2 of 110 slices shown]
[im 37/110  bone]
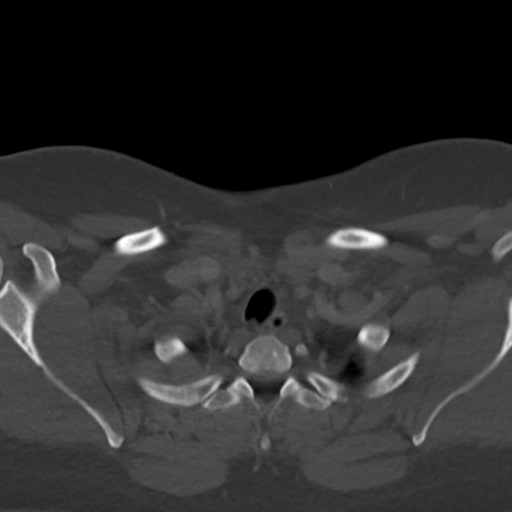
[im 73/110  bone]
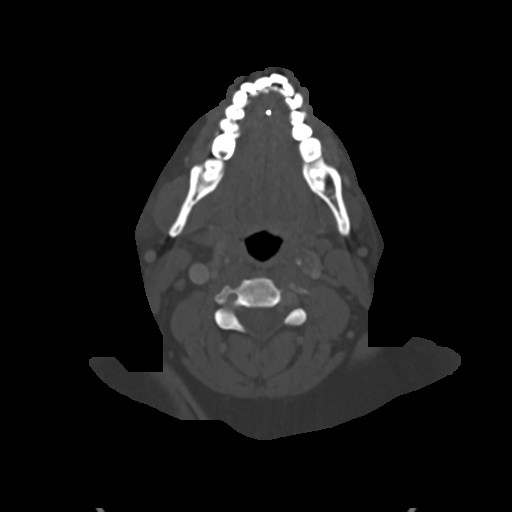

[Series 5: axial bone · axial · 0.49mm/px · z∈[-218,-146]mm · 2 of 110 slices shown]
[im 37/110  bone]
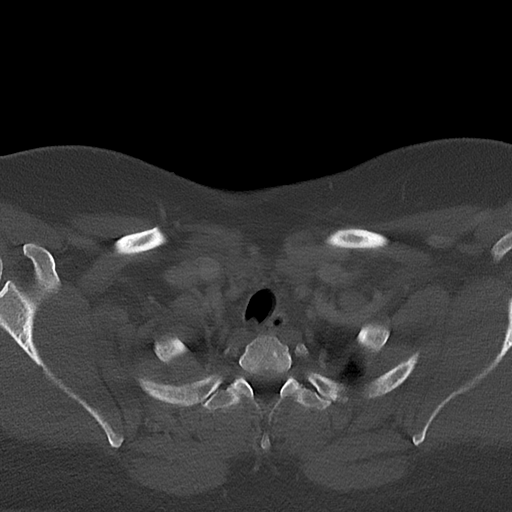
[im 73/110  bone]
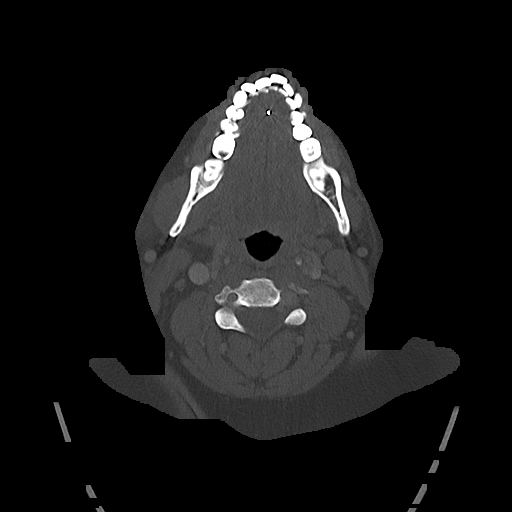

[Series 6: sag neck · sagittal · 0.47mm/px · 5 of 103 slices shown, 6 images]
[im 35/103  bone]
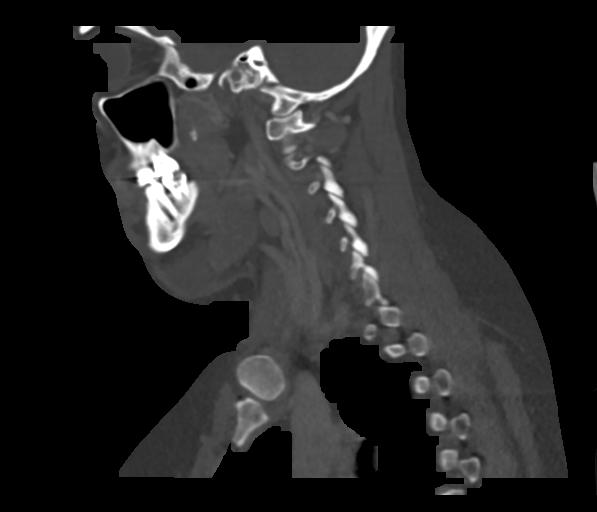
[im 43/103  bone]
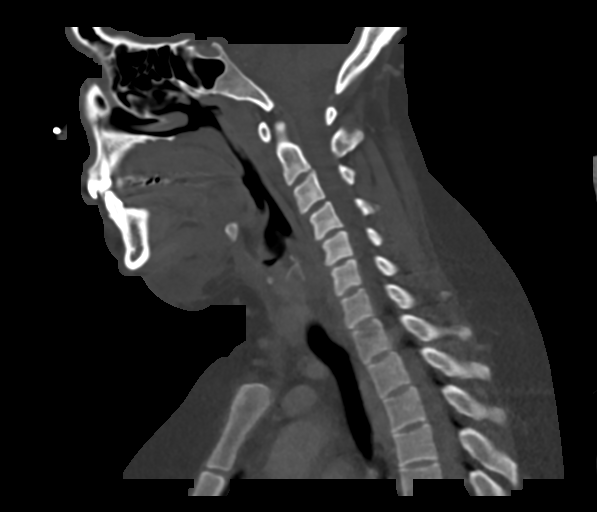
[im 52/103  soft-tissue]
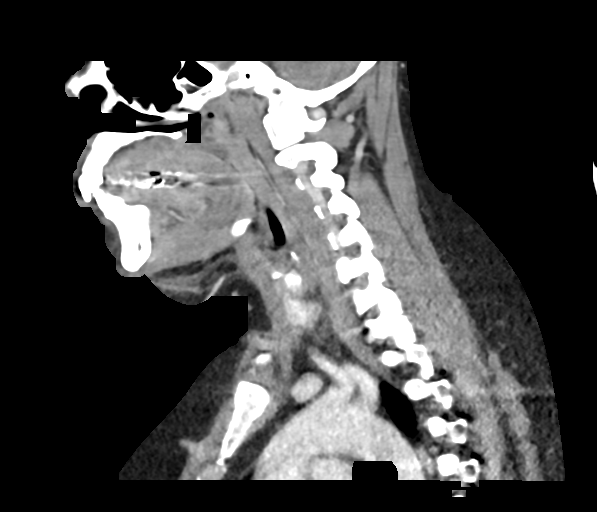
[im 52/103  bone]
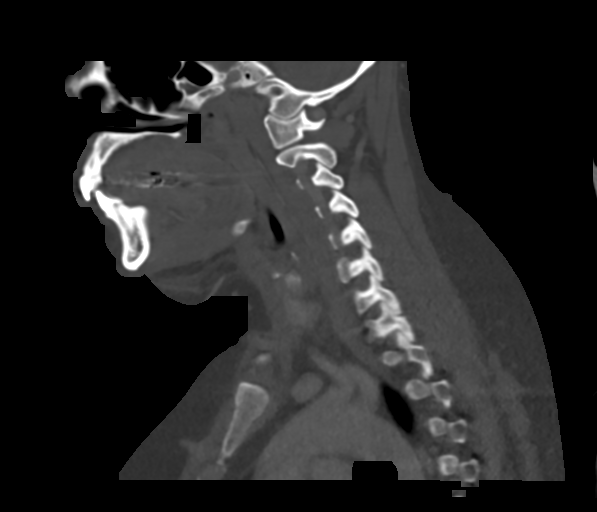
[im 60/103  bone]
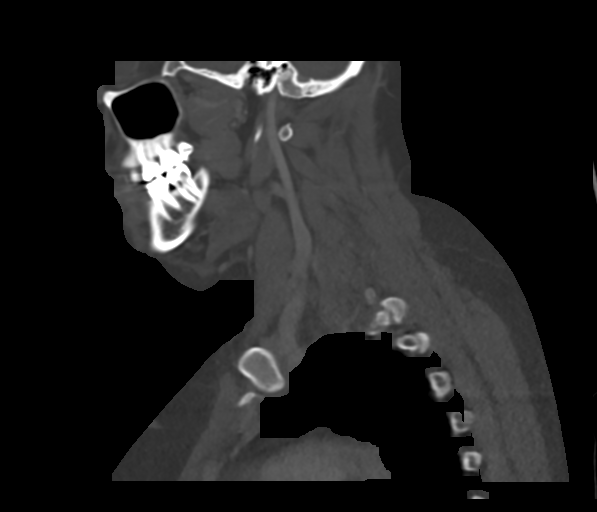
[im 69/103  bone]
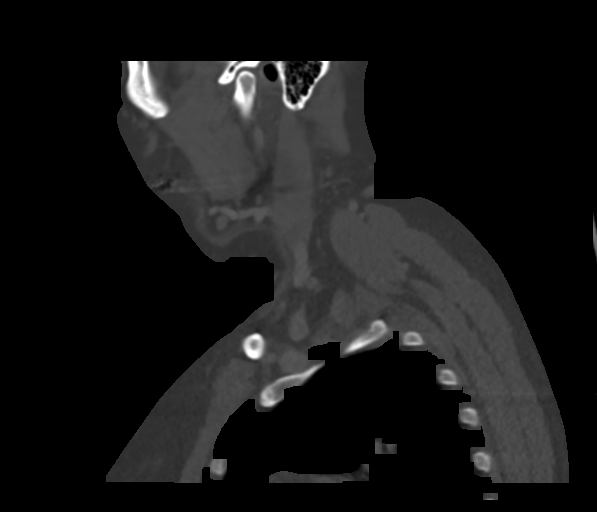

[Series 7: cor neck · coronal · 0.50mm/px · 3 of 96 slices shown]
[im 20/96  bone]
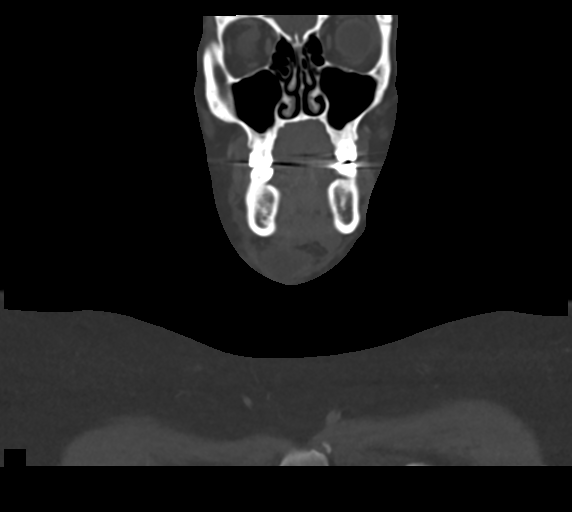
[im 39/96  bone]
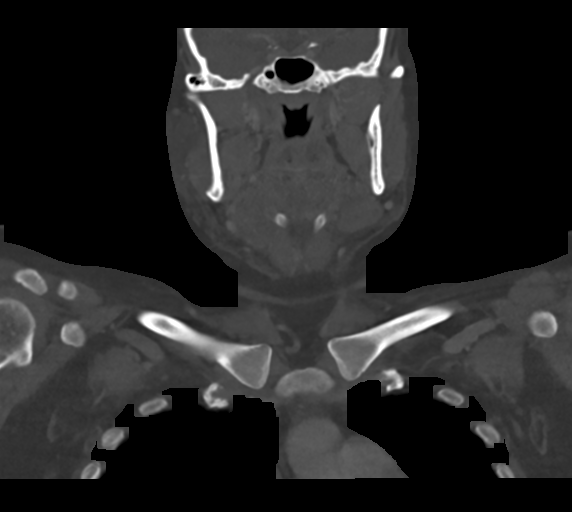
[im 58/96  bone]
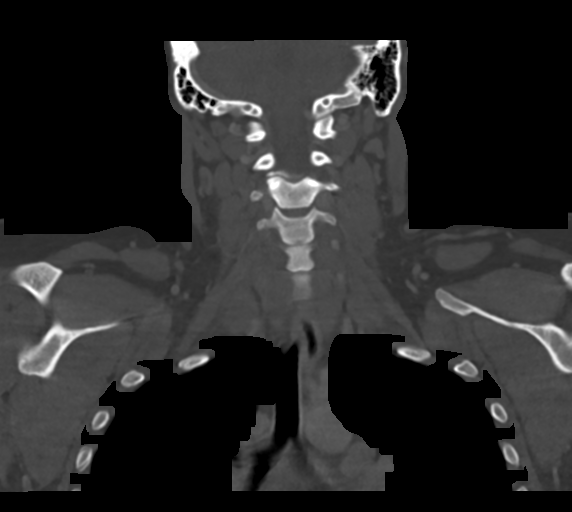

[Series 8: ax oropharynx · axial · 0.50mm/px · z∈[-247,-168]mm · 2 of 120 slices shown, 3 images]
[im 40/120  soft-tissue]
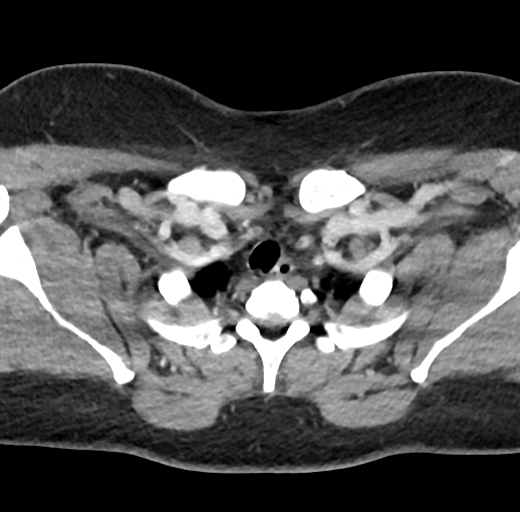
[im 40/120  bone]
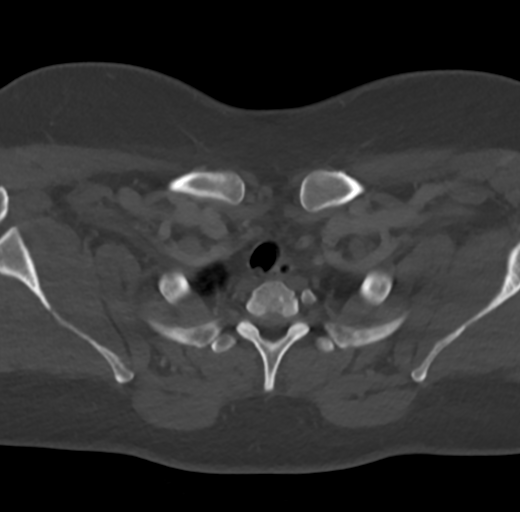
[im 80/120  bone]
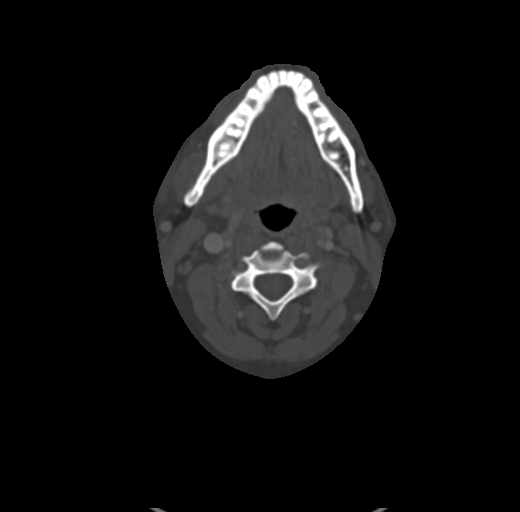

[14 of 33 positions shown; findings below may reference images not displayed]

FINDINGS: Pharynx and larynx: Mild edema is questioned within the posterior
floor of mouth. There is no appreciable swelling or discrete mass
within the pharynx or larynx.

Salivary glands: The parotid glands are unremarkable. The right
submandibular gland is posteriorly displaced by a right
submandibular space peripherally enhancing fluid collection
consistent with abscess. This collection measures 3.2 x 3.0 x 1.6 cm
(AP x TV x CC). There is surrounding inflammatory stranding
consistent with cellulitis. The left submandibular gland is
unremarkable. The posterior right mandibular molar is carious
(series 6, image 34).

Thyroid: Unremarkable.

Lymph nodes: No pathologically enlarged cervical chain lymph nodes
are identified.

Vascular: The major vascular structures of the neck are patent.

Limited intracranial: No acute intracranial abnormality identified.

Visualized orbits: Visualized orbits show no acute finding.

Mastoids and visualized paranasal sinuses: No significant paranasal
sinus disease or mastoid effusion at the imaged levels.

Skeleton: No acute bony abnormality or aggressive osseous lesion.

Upper chest: No consolidation within the imaged lung apices.

Other: 9 mm nonspecific cutaneous/subcutaneous lesion within the
right cheek (series 3, image 41).

These results were called by telephone at the time of interpretation
on 05/21/2020 at [DATE] to provider JISELA GIBSON , who verbally
acknowledged these results.
IMPRESSION: 3.2 x 3.0 x 1.6 cm peripherally enhancing collection within the
right submandibular space consistent with abscess. Surrounding
cellulitis. Additionally, subtle edema is questioned within the
right posterior floor of mouth suspicious for associated floor of
mouth infection. The right posterior mandibular molar is carious and
findings may be odontogenic in origin.

9 mm nonspecific cutaneous/subcutaneous lesion of the right cheek.
Direct visualization recommended.

## 2021-09-24 ENCOUNTER — Other Ambulatory Visit: Payer: Self-pay

## 2021-09-24 ENCOUNTER — Emergency Department (HOSPITAL_COMMUNITY)
Admission: EM | Admit: 2021-09-24 | Discharge: 2021-09-24 | Disposition: A | Discharge status: Home / Self Care | Payer: Medicaid Other | Attending: Emergency Medicine | Admitting: Emergency Medicine

## 2021-09-24 ENCOUNTER — Encounter (HOSPITAL_COMMUNITY): Payer: Self-pay

## 2021-09-24 DIAGNOSIS — L0201 Cutaneous abscess of face: Secondary | ICD-10-CM | POA: Insufficient documentation

## 2021-09-24 MED ORDER — HYDROCODONE-ACETAMINOPHEN 5-325 MG PO TABS: 1.0000 | ORAL_TABLET | Freq: Four times a day (QID) | ORAL | 0 refills | Status: AC | PRN

## 2021-09-24 MED ORDER — HYDROCODONE-ACETAMINOPHEN 5-325 MG PO TABS: 1.0000 | ORAL_TABLET | Freq: Four times a day (QID) | ORAL | 0 refills | Status: DC | PRN

## 2021-09-24 MED ORDER — LIDOCAINE-EPINEPHRINE-TETRACAINE (LET) TOPICAL GEL
3.0000 mL | Freq: Once | TOPICAL | Status: AC
  Administered 2021-09-24: 3 mL via TOPICAL
  Filled 2021-09-24: qty 3

## 2021-09-24 MED ORDER — DOXYCYCLINE HYCLATE 100 MG PO CAPS
100.0000 mg | ORAL_CAPSULE | Freq: Two times a day (BID) | ORAL | 0 refills | Status: DC
Start: 2021-09-24 — End: 2021-09-24

## 2021-09-24 MED ORDER — DOXYCYCLINE HYCLATE 100 MG PO CAPS
100.0000 mg | ORAL_CAPSULE | Freq: Two times a day (BID) | ORAL | 0 refills | Status: DC
Start: 2021-09-24 — End: 2024-02-02

## 2021-09-24 MED ORDER — LIDOCAINE-EPINEPHRINE (PF) 2 %-1:200000 IJ SOLN
10.0000 mL | Freq: Once | INTRAMUSCULAR | Status: DC
  Filled 2021-09-24: qty 20

## 2021-09-24 MED ORDER — HYDROCODONE-ACETAMINOPHEN 5-325 MG PO TABS
1.0000 | ORAL_TABLET | Freq: Once | ORAL | Status: AC
  Administered 2021-09-24: 1 via ORAL
  Filled 2021-09-24: qty 1

## 2021-09-24 NOTE — ED Provider Notes (Signed)
Emergency Medicine Provider Triage Evaluation Note  Holly Terrell , a 40 y.o. female  was evaluated in triage.  Pt complains of abscess to the right cheek.  Patient reports that for many months it has been present and small but became large and painful over the last week.  Is now draining purulent fluid.  Denies dental pain.  Does report the pain radiates into her right ear and down into her right neck.  Denies difficulty breathing, talking or swallowing.  Denies fevers or chills.  Review of Systems  Positive: Abscess, otalgia Negative: Nausea, vomiting, fevers  Physical Exam  BP 139/79 (BP Location: Right Arm)   Pulse 95   Temp 98.9 F (37.2 C) (Oral)   Resp 18   Ht 5' (1.524 m)   Wt 81.6 kg   SpO2 100%   BMI 35.15 kg/m  Gen:   Awake, no distress   Resp:  Normal effort  MSK:   Moves extremities without difficulty  Other:  Large abscess on the right cheek.     Medical Decision Making  Medically screening exam initiated at 6:09 AM.  Appropriate orders placed.  Lakelyn Straus was informed that the remainder of the evaluation will be completed by another provider, this initial triage assessment does not replace that evaluation, and the importance of remaining in the ED until their evaluation is complete.  Large abscess to the right cheek.   Awesome Jared, Boyd Kerbs 09/24/21 2440    Nira Conn, MD 09/24/21 [phone]

## 2021-09-24 NOTE — ED Provider Notes (Signed)
[facility] EMERGENCY DEPARTMENT Provider Note   CSN: [id number] Arrival date & time: 09/24/21  0524     History Chief Complaint  Patient presents with   Abscess    Holly Terrell is a 40 y.o. female.  The history is provided by the patient.  Abscess Location:  Face Facial abscess location:  R cheek Abscess quality: draining, fluctuance, induration, painful and redness   Red streaking: no   Duration:  1 week Progression:  Worsening Pain details:    Quality:  Sharp and throbbing   Severity:  Severe   Duration:  3 days   Timing:  Constant   Progression:  Improving Chronicity:  Recurrent Context comment:  Reports there was something there like a pimple for the last month but in the last week it has become painful and started swelling Relieved by:  Warm compresses Worsened by:  Draining/squeezing Ineffective treatments:  None tried Associated symptoms: no fever, no nausea and no vomiting   Associated symptoms comment:  Started to drain last night and now is 1/2 the size but draining has stopped Risk factors: prior abscess       Past Medical History:  Diagnosis Date   Medical history non-contributory    No pertinent past medical history    Pregnancy induced hypertension    Urinary tract infection     Patient Active Problem List   Diagnosis Date Noted   Acute hypoxemic respiratory failure (HCC)    Submandibular abscess 05/21/2020   Severe preeclampsia 02/26/2013   LGA (large for gestational age) fetus 02/11/2013   Elevated serum creatinine 02/11/2013   Abnormal glucose tolerance test in pregnancy, antepartum 12/18/2012   Previous cesarean delivery, antepartum condition or complication 11/19/2012    Past Surgical History:  Procedure Laterality Date   CESAREAN SECTION     CESAREAN SECTION N/A 02/16/2013   Procedure: Repeat CESAREAN SECTION of baby boy at 0221 APGAR 7/9;  Surgeon: Tilda Burrow, MD;  Location: [facility] ORS;  Service: Obstetrics;   Laterality: N/A;   INCISION AND DRAINAGE ABSCESS Right 05/23/2020   Procedure: INCISION AND DRAINAGE ABSCESS RIGHT NECK;  Surgeon: Ocie Doyne, DDS;  Location: [facility] OR;  Service: Oral Surgery;  Laterality: Right;   TONSILLECTOMY     TOOTH EXTRACTION N/A 05/23/2020   Procedure: DENTAL RESTORATION/EXTRACTIONS;  Surgeon: Ocie Doyne, DDS;  Location: [facility] OR;  Service: Oral Surgery;  Laterality: N/A;     OB History     Gravida  2   Para  2   Term  1   Preterm  1   AB  0   Living  2      SAB  0   IAB  0   Ectopic  0   Multiple  0   Live Births  2           Family History  Problem Relation Age of Onset   Hypertension Mother    Birth defects Son     Social History   Tobacco Use   Smoking status: Never   Smokeless tobacco: Never  Substance Use Topics   Alcohol use: No   Drug use: No    Home Medications Prior to Admission medications   Medication Sig Start Date End Date Taking? Authorizing Provider  cefdinir (OMNICEF) 300 MG capsule Take 1 capsule (300 mg total) by mouth every 12 (twelve) hours. 05/27/20   Theotis Barrio, MD    Allergies    Patient has no known allergies.  Review of Systems   Review of Systems  Constitutional:  Negative for fever.  Gastrointestinal:  Negative for nausea and vomiting.  All other systems reviewed and are negative.  Physical Exam Updated Vital Signs BP 125/74 (BP Location: Right Arm)   Pulse 80   Temp 98.3 F (36.8 C) (Oral)   Resp 16   Ht 5' (1.524 m)   Wt 81.6 kg   SpO2 100%   BMI 35.15 kg/m   Physical Exam Vitals and nursing note reviewed.  Constitutional:      General: She is not in acute distress.    Appearance: Normal appearance.  HENT:     Head: Normocephalic.      Mouth/Throat:     Mouth: Mucous membranes are moist.     Pharynx: Oropharynx is clear.  Eyes:     Pupils: Pupils are equal, round, and reactive to light.  Cardiovascular:     Rate and Rhythm: Normal rate.  Pulmonary:     Effort:  Pulmonary effort is normal.  Musculoskeletal:     Cervical back: Normal range of motion and neck supple.     Right lower leg: No edema.     Left lower leg: No edema.  Skin:    General: Skin is warm and dry.  Neurological:     General: No focal deficit present.     Mental Status: She is alert and oriented to person, place, and time. Mental status is at baseline.  Psychiatric:        Mood and Affect: Mood normal.        Behavior: Behavior normal.    ED Results / Procedures / Treatments   Labs (all labs ordered are listed, but only abnormal results are displayed) Labs Reviewed - No data to display  EKG None  Radiology No results found.  Procedures Procedures   INCISION AND DRAINAGE Performed by: Gwyneth Sprout Consent: Verbal consent obtained. Risks and benefits: risks, benefits and alternatives were discussed Type: abscess  Body area: right cheek  Anesthesia: LET 1%  Anesthetic total: 1 ml  Complexity: simple Drainage: purulent  Drainage amount: large amount of purulent material (3mL) removed by squeezing. No scalpel needed.  Packing material: none  Patient tolerance: Patient tolerated the procedure well with no immediate complications.    Medications Ordered in ED Medications  lidocaine-EPINEPHrine-tetracaine (LET) topical gel (has no administration in time range)    ED Course  I have reviewed the triage vital signs and the nursing notes.  Pertinent labs & imaging results that were available during my care of the patient were reviewed by me and considered in my medical decision making (see chart for details).    MDM Rules/Calculators/A&P                           Patient is a 40 year old female presenting today with an uncomplicated facial abscess.  No oral involvement.  Patient does have some surrounding cellulitis and will require doxycycline.  There has been some drainage from the wound but still appears that there is pus that needs to be  drained.  Patient is hesitant for drainage due to the pain she is in.  Let was placed. Abscess drain by squeezing only.  Large amount of sebaceous material and purulent drainage expressed.  Patient placed on doxycycline.  Final Clinical Impression(s) / ED Diagnoses Final diagnoses:  Abscess of right external cheek    Rx / DC Orders ED Discharge Orders  Ordered    HYDROcodone-acetaminophen (NORCO/VICODIN) 5-325 MG tablet  Every 6 hours PRN        09/24/21 1152    doxycycline (VIBRAMYCIN) 100 MG capsule  2 times daily        09/24/21 1152             Gwyneth Sprout, MD 09/24/21 1157

## 2021-09-24 NOTE — ED Notes (Signed)
Pt has abscess on right side of cheek approx the size of a quarter. The abscess is erythematous and the skin surrounding the abscess is erythematous. The abscess is draining a small amount of serosanguous drainage.

## 2021-09-24 NOTE — Discharge Instructions (Signed)
It will continue to drain over the next 2 to 3 days.  You can just keep a bandage on it.  It will be significantly better tomorrow.  Take the antibiotics and use the pain medication as needed.

## 2021-09-24 NOTE — ED Notes (Signed)
Pt is not driving. Pt's girlfriend is driving.

## 2021-09-24 NOTE — ED Triage Notes (Signed)
Pt with facial draining facial abscess to R cheek for approximately one week. Pt states she had dental abscess to same side approximately one week ago.

## 2023-09-02 ENCOUNTER — Ambulatory Visit: Payer: 59 | Admitting: Family

## 2023-09-03 ENCOUNTER — Telehealth: Payer: Self-pay | Admitting: Pediatrics

## 2023-09-03 ENCOUNTER — Encounter: Payer: 59 | Admitting: Family

## 2023-09-03 NOTE — Telephone Encounter (Signed)
Called pt and left vm to call office back to rescheduled missed NP appt

## 2023-09-03 NOTE — Progress Notes (Signed)
Erroneous encounter-disregard

## 2023-11-15 ENCOUNTER — Telehealth: Payer: Self-pay | Admitting: Pediatrics

## 2023-11-15 ENCOUNTER — Encounter: Payer: 59 | Admitting: Family

## 2023-11-15 NOTE — Telephone Encounter (Signed)
Called patient to rescheduled missed new patient appointment

## 2023-11-15 NOTE — Progress Notes (Signed)
 Erroneous encounter-disregard

## 2024-02-02 ENCOUNTER — Encounter: Payer: Self-pay | Admitting: *Deleted

## 2024-02-02 ENCOUNTER — Ambulatory Visit (INDEPENDENT_AMBULATORY_CARE_PROVIDER_SITE_OTHER)

## 2024-02-02 ENCOUNTER — Other Ambulatory Visit: Payer: Self-pay

## 2024-02-02 ENCOUNTER — Ambulatory Visit
Admission: EM | Admit: 2024-02-02 | Discharge: 2024-02-02 | Disposition: A | Discharge status: Home / Self Care | Attending: Internal Medicine | Admitting: Internal Medicine

## 2024-02-02 DIAGNOSIS — J208 Acute bronchitis due to other specified organisms: Secondary | ICD-10-CM

## 2024-02-02 DIAGNOSIS — M549 Dorsalgia, unspecified: Secondary | ICD-10-CM

## 2024-02-02 DIAGNOSIS — R062 Wheezing: Secondary | ICD-10-CM

## 2024-02-02 DIAGNOSIS — R051 Acute cough: Secondary | ICD-10-CM

## 2024-02-02 MED ORDER — PREDNISONE 20 MG PO TABS: 40.0000 mg | ORAL_TABLET | Freq: Every day | ORAL | 0 refills | Status: AC

## 2024-02-02 MED ORDER — PROMETHAZINE-DM 6.25-15 MG/5ML PO SYRP: 5.0000 mL | ORAL_SOLUTION | Freq: Three times a day (TID) | ORAL | 0 refills | Status: AC | PRN

## 2024-02-02 MED ORDER — METHOCARBAMOL 500 MG PO TABS: 500.0000 mg | ORAL_TABLET | Freq: Two times a day (BID) | ORAL | 0 refills | Status: AC

## 2024-02-02 MED ORDER — AZITHROMYCIN 250 MG PO TABS: 250.0000 mg | ORAL_TABLET | Freq: Every day | ORAL | 0 refills | Status: AC

## 2024-02-02 MED ORDER — KETOROLAC TROMETHAMINE 30 MG/ML IJ SOLN
30.0000 mg | Freq: Once | INTRAMUSCULAR | Status: AC
  Administered 2024-02-02: 30 mg via INTRAMUSCULAR

## 2024-02-02 MED ORDER — ALBUTEROL SULFATE HFA 108 (90 BASE) MCG/ACT IN AERS: 1.0000 | INHALATION_SPRAY | Freq: Four times a day (QID) | RESPIRATORY_TRACT | 0 refills | Status: AC | PRN

## 2024-02-02 MED ORDER — BENZONATATE 100 MG PO CAPS: 100.0000 mg | ORAL_CAPSULE | Freq: Three times a day (TID) | ORAL | 0 refills | Status: AC

## 2024-02-02 MED ORDER — METHYLPREDNISOLONE ACETATE 80 MG/ML IJ SUSP
40.0000 mg | Freq: Once | INTRAMUSCULAR | Status: AC
  Administered 2024-02-02: 40 mg via INTRAMUSCULAR

## 2024-02-02 NOTE — Discharge Instructions (Addendum)
 Chest x-ray done today due to physical exam findings and symptoms.  The final evaluation by the radiologist shows no acute findings.  Symptoms and physical exam findings are most consistent with acute bronchitis.  There is also ongoing recurrent back pain with spasms.  We will treat with the following: Toradol  injection given today. This is a medication to help with pain. This is not a narcotic.  Medrol  injection given today. This is a steroid to help with inflammation and pain.  This will help with the airways and back pain. Azithromycin  250mg  Take 2 tablets today and the 1 tablet daily for 4 more days.  This is an antibiotic. Prednisone  40 mg (2 tablets) once daily for 5 days. Take this in the morning.  This is a steroid to help with inflammation and pain.  Start this medication on 02/03/2024 Albuterol  inhaler 1-2 puffs every 6 hours as needed for wheezing/shortness of breath.  Benzonatate  (tessalon ) 100 mg every 8 hours as needed for cough.  Take this medication during the day Promethazine  DM 5 mL every 8 hours as needed for cough.  Use caution as this medication can cause drowsiness. DO not use this medication if you are taking robaxin . Robaxin  500 mg every 12 hours as needed for muscle spasms.  Use caution as this medication can cause drowsiness. Do not use this medication if you are using the promethazine  DM Rest and stay hydrated.   Return to urgent care or PCP if symptoms worsen or fail to resolve.

## 2024-02-02 NOTE — ED Provider Notes (Signed)
 [facility]    CSN: [id number] Arrival date & time: 02/02/24  1106      History   Chief Complaint Chief Complaint  Patient presents with   Cough    HPI Holly Terrell is a 43 y.o. female.   43 year old female who presents urgent care with complaints of worsening cough, chest congestion, headaches and sore throat.  Her symptoms started on Thursday.  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  She also has had diarrhea for the last 2 days.  Her appetite is poor but she is able to drink and eat some.  She has had a little bit of nausea but this is more from coughing.  She is having wheezing as well.  She has headaches at baseline but they are little bit worse recently.  She is also having persistent back pain with spasms that has been ongoing for quite some time.  She reports that the spasms will hit at times and are severe especially on the right side.  She has not had any specific injury but does a lot of bending lifting at work.     Cough Associated symptoms: headaches   Associated symptoms: no chest pain, no chills, no ear pain, no fever, no rash, no shortness of breath and no sore throat     Past Medical History:  Diagnosis Date   Medical history non-contributory    No pertinent past medical history    Pregnancy induced hypertension    Urinary tract infection     Patient Active Problem List   Diagnosis Date Noted   Acute hypoxemic respiratory failure (HCC)    Submandibular abscess 05/21/2020   Severe preeclampsia 02/26/2013   LGA (large for gestational age) fetus 02/11/2013   Elevated serum creatinine 02/11/2013   Abnormal glucose tolerance test in pregnancy, antepartum 12/18/2012   Previous cesarean delivery, antepartum condition or complication 11/19/2012    Past Surgical History:  Procedure Laterality Date   CESAREAN SECTION     CESAREAN SECTION N/A 02/16/2013   Procedure: Repeat CESAREAN SECTION of baby  boy at 0221 APGAR 7/9;  Surgeon: Albino Hum, MD;  Location: [facility];  Service: Obstetrics;  Laterality: N/A;   INCISION AND DRAINAGE ABSCESS Right 05/23/2020   Procedure: INCISION AND DRAINAGE ABSCESS RIGHT NECK;  Surgeon: Ascencion Lava, DDS;  Location: [facility] OR;  Service: Oral Surgery;  Laterality: Right;   TONSILLECTOMY     TOOTH EXTRACTION N/A 05/23/2020   Procedure: DENTAL RESTORATION/EXTRACTIONS;  Surgeon: Ascencion Lava, DDS;  Location[facility];  Service: Oral Surgery;  Laterality: N/A;    OB History     Gravida  2   Para  2   Term  1   Preterm  1   AB  0   Living  2      SAB  0   IAB  0   Ectopic  0   Multiple  0   Live Births  2            Home Medications    Prior to Admission medications   Medication Sig Start Date End Date Taking? Authorizing Provider  albuterol  (VENTOLIN  HFA) 108 (90 Base) MCG/ACT inhaler Inhale 1-2 puffs into the lungs every 6 (six) hours as needed for wheezing or shortness of breath. 02/02/24  Yes Corwin Kuiken A, PA-C  azithromycin  (ZITHROMAX ) 250 MG tablet Take 1 tablet (250 mg total) by mouth daily. Take first  2 tablets together, then 1 every day until finished. 02/02/24  Yes Akeya Ryther A, PA-C  benzonatate  (TESSALON ) 100 MG capsule Take 1 capsule (100 mg total) by mouth every 8 (eight) hours. 02/02/24  Yes Cayle Cordoba A, PA-C  methocarbamol  (ROBAXIN ) 500 MG tablet Take 1 tablet (500 mg total) by mouth 2 (two) times daily. 02/02/24  Yes Charolett Yarrow A, PA-C  predniSONE  (DELTASONE ) 20 MG tablet Take 2 tablets (40 mg total) by mouth daily with breakfast for 5 days. 02/02/24 02/07/24 Yes Ileah Falkenstein A, PA-C  promethazine -dextromethorphan (PROMETHAZINE -DM) 6.25-15 MG/5ML syrup Take 5 mLs by mouth every 8 (eight) hours as needed for cough. 02/02/24  Yes Jla Reynolds A, PA-C  HYDROcodone -acetaminophen  (NORCO/VICODIN) 5-325 MG tablet Take 1 tablet by mouth every 6 (six) hours as needed. Patient not taking: Reported on  02/02/2024 09/24/21   Almond Army, MD    Family History Family History  Problem Relation Age of Onset   Hypertension Mother    Birth defects Son     Social History Social History   Tobacco Use   Smoking status: Never   Smokeless tobacco: Never  Substance Use Topics   Alcohol use: Not Currently   Drug use: No     Allergies   Patient has no known allergies.   Review of Systems Review of Systems  Constitutional:  Positive for appetite change. Negative for chills and fever.  HENT:  Negative for ear pain and sore throat.   Eyes:  Negative for pain and visual disturbance.  Respiratory:  Positive for cough. Negative for shortness of breath.        Chest congestion  Cardiovascular:  Negative for chest pain and palpitations.  Gastrointestinal:  Positive for diarrhea (2-3 days). Negative for abdominal pain, nausea and vomiting.  Genitourinary:  Negative for dysuria and hematuria.  Musculoskeletal:  Negative for arthralgias and back pain.  Skin:  Negative for color change and rash.  Neurological:  Positive for headaches. Negative for seizures and syncope.  All other systems reviewed and are negative.    Physical Exam Triage Vital Signs ED Triage Vitals  Encounter Vitals Group     BP 02/02/24 1142 121/81     Systolic BP Percentile --      Diastolic BP Percentile --      Pulse --      Resp 02/02/24 1142 20     Temp 02/02/24 1142 98.7 F (37.1 C)     Temp Source 02/02/24 1142 Oral     SpO2 02/02/24 1142 96 %     Weight --      Height --      Head Circumference --      Peak Flow --      Pain Score 02/02/24 1139 6     Pain Loc --      Pain Education --      Exclude from Growth Chart --    No data found.  Updated Vital Signs BP 121/81 (BP Location: Left Arm)   Temp 98.7 F (37.1 C) (Oral)   Resp 20   LMP 01/02/2024 (Approximate)   SpO2 96%   Visual Acuity Right Eye Distance:   Left Eye Distance:   Bilateral Distance:    Right Eye Near:   Left Eye  Near:    Bilateral Near:     Physical Exam   UC Treatments / Results  Labs (all labs ordered are listed, but only abnormal results are displayed) Labs Reviewed - No data to  display  EKG   Radiology DG Chest 2 View Result Date: 02/02/2024 CLINICAL DATA:  Cough and wheezing. EXAM: CHEST - 2 VIEW COMPARISON:  One view chest x-ray 05/24/2020 FINDINGS: The heart size and mediastinal contours are within normal limits. Both lungs are clear. The visualized skeletal structures are unremarkable. IMPRESSION: Negative two view chest x-ray Electronically Signed   By: Audree Leas M.D.   On: 02/02/2024 12:12    Procedures Procedures (including critical care time)  Medications Ordered in UC Medications  ketorolac  (TORADOL ) 30 MG/ML injection 30 mg (30 mg Intramuscular Given 02/02/24 1238)  methylPREDNISolone  acetate (DEPO-MEDROL ) injection 40 mg (40 mg Intramuscular Given 02/02/24 1238)    Initial Impression / Assessment and Plan / UC Course  I have reviewed the triage vital signs and the nursing notes.  Pertinent labs & imaging results that were available during my care of the patient were reviewed by me and considered in my medical decision making (see chart for details).     Acute bronchitis due to other specified organisms  Acute cough - Plan: DG Chest 2 View, DG Chest 2 View  Mid back pain  Wheezing   Chest x-ray done today due to physical exam findings and symptoms.  The final evaluation by the radiologist shows no acute findings.  Symptoms and physical exam findings are most consistent with acute bronchitis.  There is also ongoing recurrent back pain with spasms.  We will treat with the following: Toradol  injection given today. This is a medication to help with pain. This is not a narcotic.  Medrol  injection given today. This is a steroid to help with inflammation and pain.  This will help with the airways and back pain. Azithromycin  250mg  Take 2 tablets today and the 1  tablet daily for 4 more days.  This is an antibiotic. Prednisone  40 mg (2 tablets) once daily for 5 days. Take this in the morning.  This is a steroid to help with inflammation and pain.  Start this medication on 02/03/2024 Albuterol  inhaler 1-2 puffs every 6 hours as needed for wheezing/shortness of breath.  Benzonatate  (tessalon ) 100 mg every 8 hours as needed for cough.  Take this medication during the day Promethazine  DM 5 mL every 8 hours as needed for cough.  Use caution as this medication can cause drowsiness. DO not use this medication if you are taking robaxin . Robaxin  500 mg every 12 hours as needed for muscle spasms.  Use caution as this medication can cause drowsiness. Do not use this medication if you are using the promethazine  DM Rest and stay hydrated.   Return to urgent care or PCP if symptoms worsen or fail to resolve.    Final Clinical Impressions(s) / UC Diagnoses   Final diagnoses:  Acute cough  Mid back pain  Wheezing  Acute bronchitis due to other specified organisms     Discharge Instructions      Chest x-ray done today due to physical exam findings and symptoms.  The final evaluation by the radiologist shows no acute findings.  Symptoms and physical exam findings are most consistent with acute bronchitis.  There is also ongoing recurrent back pain with spasms.  We will treat with the following: Toradol  injection given today. This is a medication to help with pain. This is not a narcotic.  Medrol  injection given today. This is a steroid to help with inflammation and pain.  This will help with the airways and back pain. Azithromycin  250mg  Take 2 tablets today and  the 1 tablet daily for 4 more days.  This is an antibiotic. Prednisone  40 mg (2 tablets) once daily for 5 days. Take this in the morning.  This is a steroid to help with inflammation and pain.  Start this medication on 02/03/2024 Albuterol  inhaler 1-2 puffs every 6 hours as needed for wheezing/shortness of  breath.  Benzonatate  (tessalon ) 100 mg every 8 hours as needed for cough.  Take this medication during the day Promethazine  DM 5 mL every 8 hours as needed for cough.  Use caution as this medication can cause drowsiness. DO not use this medication if you are taking robaxin . Robaxin  500 mg every 12 hours as needed for muscle spasms.  Use caution as this medication can cause drowsiness. Do not use this medication if you are using the promethazine  DM Rest and stay hydrated.   Return to urgent care or PCP if symptoms worsen or fail to resolve.       ED Prescriptions     Medication Sig Dispense Auth. Provider   azithromycin  (ZITHROMAX ) 250 MG tablet Take 1 tablet (250 mg total) by mouth daily. Take first 2 tablets together, then 1 every day until finished. 6 tablet Nthony Lefferts A, PA-C   predniSONE  (DELTASONE ) 20 MG tablet Take 2 tablets (40 mg total) by mouth daily with breakfast for 5 days. 10 tablet Kreg Pesa, PA-C   albuterol  (VENTOLIN  HFA) 108 (90 Base) MCG/ACT inhaler Inhale 1-2 puffs into the lungs every 6 (six) hours as needed for wheezing or shortness of breath. 6.7 g Niesha Bame A, PA-C   benzonatate  (TESSALON ) 100 MG capsule Take 1 capsule (100 mg total) by mouth every 8 (eight) hours. 21 capsule Shamir Sedlar A, PA-C   promethazine -dextromethorphan (PROMETHAZINE -DM) 6.25-15 MG/5ML syrup Take 5 mLs by mouth every 8 (eight) hours as needed for cough. 180 mL Makia Bossi A, PA-C   methocarbamol  (ROBAXIN ) 500 MG tablet Take 1 tablet (500 mg total) by mouth 2 (two) times daily. 20 tablet Kreg Pesa, [place]      PDMP not reviewed this encounter.   Kreg Pesa, [place] 02/02/24 1241

## 2024-02-02 NOTE — ED Triage Notes (Signed)
 Cough since Thursday- using robitussin and cough drops. Denies known fever. C/o sore throat and states chest hurts with cough and taking a deep breath

## 2024-07-20 ENCOUNTER — Telehealth: Payer: Self-pay | Admitting: Emergency Medicine

## 2024-07-20 NOTE — Telephone Encounter (Signed)
 Copied from CRM #[id number]. Topic: Clinical - Request for Lab/Test Order >> Jul 20, 2024  9:56 AM Harlene ORN wrote: Reason for CRM: Patient called. Wants to know if the x-ray was up and running to have her x-ray done. Please call back the patient to confirm.  I returned patient call and made her aware that xray was up and running.  Patient stated she would come in today
